# Patient Record
Sex: Female | Born: 1970 | Race: Black or African American | Hispanic: No | Marital: Single | Smoking: Never smoker
Health system: Southern US, Community
[De-identification: ages and names within clinical notes are randomized; demographics above are authoritative.]

## PROBLEM LIST (undated history)

## (undated) DIAGNOSIS — J329 Chronic sinusitis, unspecified: Secondary | ICD-10-CM

## (undated) DIAGNOSIS — R600 Localized edema: Secondary | ICD-10-CM

## (undated) DIAGNOSIS — E119 Type 2 diabetes mellitus without complications: Secondary | ICD-10-CM

## (undated) DIAGNOSIS — Z8719 Personal history of other diseases of the digestive system: Secondary | ICD-10-CM

## (undated) DIAGNOSIS — N6009 Solitary cyst of unspecified breast: Secondary | ICD-10-CM

## (undated) DIAGNOSIS — D649 Anemia, unspecified: Secondary | ICD-10-CM

## (undated) DIAGNOSIS — I10 Essential (primary) hypertension: Secondary | ICD-10-CM

## (undated) DIAGNOSIS — R51 Headache: Secondary | ICD-10-CM

## (undated) DIAGNOSIS — R519 Headache, unspecified: Secondary | ICD-10-CM

## (undated) DIAGNOSIS — T7840XA Allergy, unspecified, initial encounter: Secondary | ICD-10-CM

## (undated) HISTORY — PX: WISDOM TOOTH EXTRACTION: SHX21

## (undated) HISTORY — PX: BREAST BIOPSY: SHX20

## (undated) HISTORY — DX: Allergy, unspecified, initial encounter: T78.40XA

---

## 1999-10-08 ENCOUNTER — Other Ambulatory Visit: Admission: RE | Admit: 1999-10-08 | Discharge: 1999-10-08 | Payer: Self-pay | Admitting: Family Medicine

## 2004-03-11 ENCOUNTER — Ambulatory Visit: Payer: Self-pay | Admitting: Family Medicine

## 2005-05-28 ENCOUNTER — Ambulatory Visit: Payer: Self-pay | Admitting: Family Medicine

## 2005-08-06 ENCOUNTER — Ambulatory Visit: Payer: Self-pay | Admitting: Internal Medicine

## 2005-09-03 ENCOUNTER — Other Ambulatory Visit: Admission: RE | Admit: 2005-09-03 | Discharge: 2005-09-03 | Payer: Self-pay | Admitting: Family Medicine

## 2005-09-03 ENCOUNTER — Ambulatory Visit: Payer: Self-pay | Admitting: Family Medicine

## 2005-09-06 ENCOUNTER — Ambulatory Visit (HOSPITAL_COMMUNITY): Admission: RE | Admit: 2005-09-06 | Discharge: 2005-09-06 | Payer: Self-pay | Admitting: Family Medicine

## 2005-11-02 ENCOUNTER — Ambulatory Visit (HOSPITAL_COMMUNITY): Admission: RE | Admit: 2005-11-02 | Discharge: 2005-11-02 | Payer: Self-pay | Admitting: Family Medicine

## 2006-06-13 ENCOUNTER — Ambulatory Visit: Payer: Self-pay | Admitting: Family Medicine

## 2006-06-20 ENCOUNTER — Ambulatory Visit (HOSPITAL_COMMUNITY): Admission: RE | Admit: 2006-06-20 | Discharge: 2006-06-20 | Payer: Self-pay | Admitting: Family Medicine

## 2006-08-30 ENCOUNTER — Encounter: Payer: Self-pay | Admitting: Family Medicine

## 2006-08-30 LAB — CONVERTED CEMR LAB
Basophils Absolute: 0 10*3/uL (ref 0.0–0.1)
Basophils Relative: 1 % (ref 0–1)
CO2: 23 meq/L (ref 19–32)
Calcium: 9.5 mg/dL (ref 8.4–10.5)
Chloride: 107 meq/L (ref 96–112)
Creatinine, Ser: 0.86 mg/dL (ref 0.40–1.20)
HDL: 66 mg/dL (ref >39)
Hemoglobin: 12.1 g/dL (ref 12.0–15.0)
LDL Cholesterol: 85 mg/dL (ref 0–99)
Lymphocytes Relative: 48 % — ABNORMAL HIGH (ref 12–46)
MCHC: 31.3 g/dL (ref 30.0–36.0)
Monocytes Absolute: 0.4 10*3/uL (ref 0.2–0.7)
Neutro Abs: 1.6 10*3/uL — ABNORMAL LOW (ref 1.7–7.7)
Platelets: 249 10*3/uL (ref 150–400)
RDW: 14.4 % — ABNORMAL HIGH (ref 11.5–14.0)
Sodium: 139 meq/L (ref 135–145)
Total CHOL/HDL Ratio: 2.4
Triglycerides: 39 mg/dL (ref <150)

## 2006-09-05 ENCOUNTER — Ambulatory Visit: Payer: Self-pay | Admitting: Family Medicine

## 2006-09-05 ENCOUNTER — Other Ambulatory Visit: Admission: RE | Admit: 2006-09-05 | Discharge: 2006-09-05 | Payer: Self-pay | Admitting: Family Medicine

## 2006-09-05 ENCOUNTER — Encounter: Payer: Self-pay | Admitting: Family Medicine

## 2007-01-26 ENCOUNTER — Encounter: Payer: Self-pay | Admitting: Family Medicine

## 2007-02-16 ENCOUNTER — Encounter: Payer: Self-pay | Admitting: Family Medicine

## 2007-09-26 ENCOUNTER — Telehealth: Payer: Self-pay | Admitting: Family Medicine

## 2009-12-26 ENCOUNTER — Ambulatory Visit (HOSPITAL_COMMUNITY): Admission: RE | Admit: 2009-12-26 | Discharge: 2009-12-26 | Payer: Self-pay | Admitting: Family Medicine

## 2010-01-02 ENCOUNTER — Encounter: Payer: Self-pay | Admitting: Family Medicine

## 2010-01-02 ENCOUNTER — Telehealth: Payer: Self-pay | Admitting: Family Medicine

## 2010-01-12 ENCOUNTER — Encounter: Admission: RE | Admit: 2010-01-12 | Discharge: 2010-01-12 | Payer: Self-pay | Admitting: Family Medicine

## 2010-03-17 NOTE — Progress Notes (Signed)
Summary: returning call  Phone Note Call from Patient   Summary of Call: returned call call back at her work # [phone] Initial call taken by: Lind Guest,  January 02, 2010 8:19 AM  Follow-up for Phone Call        returned call, left message Follow-up by: Adella Hare LPN,  January 02, 2010 8:29 AM  Additional Follow-up for Phone Call Additional follow up Details #1::        patient has been contacted by breast center and has appt Additional Follow-up by: Adella Hare LPN,  January 02, 2010 2:02 PM

## 2010-03-17 NOTE — Letter (Signed)
Summary: breast center  breast center   Imported By: Lind Guest 01/02/2010 14:37:29  _____________________________________________________________________  External Attachment:    Type:   Image     Comment:   External Document

## 2010-03-17 NOTE — Letter (Signed)
Summary: [facility] chart  [facility] chart   Imported By: Curtis Sites 08/08/2009 08:50:11  _____________________________________________________________________  External Attachment:    Type:   Image     Comment:   External Document

## 2010-07-14 ENCOUNTER — Other Ambulatory Visit: Payer: Self-pay | Admitting: Family Medicine

## 2010-07-14 DIAGNOSIS — N6009 Solitary cyst of unspecified breast: Secondary | ICD-10-CM

## 2010-07-24 ENCOUNTER — Ambulatory Visit
Admission: RE | Admit: 2010-07-24 | Discharge: 2010-07-24 | Disposition: A | Discharge status: Home / Self Care | Payer: BC Managed Care – PPO | Source: Ambulatory Visit | Attending: Family Medicine | Admitting: Family Medicine

## 2010-07-24 DIAGNOSIS — N6009 Solitary cyst of unspecified breast: Secondary | ICD-10-CM

## 2011-05-03 ENCOUNTER — Ambulatory Visit: Payer: BC Managed Care – PPO | Admitting: Family Medicine

## 2011-05-14 ENCOUNTER — Other Ambulatory Visit: Payer: Self-pay | Admitting: Family Medicine

## 2011-05-14 DIAGNOSIS — Z1231 Encounter for screening mammogram for malignant neoplasm of breast: Secondary | ICD-10-CM

## 2011-05-17 ENCOUNTER — Ambulatory Visit: Payer: BC Managed Care – PPO

## 2011-05-24 ENCOUNTER — Telehealth: Payer: Self-pay | Admitting: Family Medicine

## 2011-05-24 ENCOUNTER — Other Ambulatory Visit: Payer: Self-pay | Admitting: Family Medicine

## 2011-05-24 ENCOUNTER — Ambulatory Visit
Admission: RE | Admit: 2011-05-24 | Discharge: 2011-05-24 | Disposition: A | Discharge status: Home / Self Care | Payer: BC Managed Care – PPO | Source: Ambulatory Visit | Attending: Family Medicine | Admitting: Family Medicine

## 2011-05-24 DIAGNOSIS — Z1231 Encounter for screening mammogram for malignant neoplasm of breast: Secondary | ICD-10-CM

## 2011-05-25 ENCOUNTER — Other Ambulatory Visit: Payer: Self-pay | Admitting: Family Medicine

## 2011-05-25 DIAGNOSIS — R928 Other abnormal and inconclusive findings on diagnostic imaging of breast: Secondary | ICD-10-CM

## 2011-05-27 ENCOUNTER — Other Ambulatory Visit: Payer: Self-pay | Admitting: Family Medicine

## 2011-05-27 ENCOUNTER — Ambulatory Visit
Admission: RE | Admit: 2011-05-27 | Discharge: 2011-05-27 | Disposition: A | Discharge status: Home / Self Care | Payer: BC Managed Care – PPO | Source: Ambulatory Visit | Attending: Family Medicine | Admitting: Family Medicine

## 2011-05-27 DIAGNOSIS — N6320 Unspecified lump in the left breast, unspecified quadrant: Secondary | ICD-10-CM

## 2011-05-27 DIAGNOSIS — R928 Other abnormal and inconclusive findings on diagnostic imaging of breast: Secondary | ICD-10-CM

## 2011-05-30 NOTE — Telephone Encounter (Signed)
Pt contacted re abnormal mammogram and need for follow up, appt has been made to see Dr Jeanice Lim and she is aware of abn mammogram, of not she has an aunt dx with breast ca under the age of 34

## 2011-06-01 ENCOUNTER — Ambulatory Visit (INDEPENDENT_AMBULATORY_CARE_PROVIDER_SITE_OTHER): Payer: BC Managed Care – PPO | Admitting: Family Medicine

## 2011-06-01 ENCOUNTER — Encounter: Payer: Self-pay | Admitting: Family Medicine

## 2011-06-01 VITALS — BP 140/90 | HR 82 | Resp 16 | Ht 65.0 in | Wt 185.4 lb

## 2011-06-01 DIAGNOSIS — K5900 Constipation, unspecified: Secondary | ICD-10-CM | POA: Insufficient documentation

## 2011-06-01 DIAGNOSIS — K644 Residual hemorrhoidal skin tags: Secondary | ICD-10-CM

## 2011-06-01 DIAGNOSIS — J302 Other seasonal allergic rhinitis: Secondary | ICD-10-CM

## 2011-06-01 DIAGNOSIS — Z1321 Encounter for screening for nutritional disorder: Secondary | ICD-10-CM

## 2011-06-01 DIAGNOSIS — E669 Obesity, unspecified: Secondary | ICD-10-CM | POA: Insufficient documentation

## 2011-06-01 DIAGNOSIS — N6009 Solitary cyst of unspecified breast: Secondary | ICD-10-CM

## 2011-06-01 DIAGNOSIS — Z130 Encounter for screening for diseases of the blood and blood-forming organs and certain disorders involving the immune mechanism: Secondary | ICD-10-CM

## 2011-06-01 DIAGNOSIS — J309 Allergic rhinitis, unspecified: Secondary | ICD-10-CM

## 2011-06-01 DIAGNOSIS — J3089 Other allergic rhinitis: Secondary | ICD-10-CM | POA: Insufficient documentation

## 2011-06-01 MED ORDER — HYDROCORTISONE 2.5 % RE CREA: TOPICAL_CREAM | RECTAL | Status: AC

## 2011-06-01 MED ORDER — DOCUSATE SODIUM 100 MG PO CAPS: 100.0000 mg | ORAL_CAPSULE | Freq: Every day | ORAL | Status: AC | PRN

## 2011-06-01 NOTE — Progress Notes (Signed)
  Subjective:    Patient ID: Alexa Savage, female    DOB: 1970/10/31, 41 y.o.   MRN: [id number]  HPI  Patient here to reestablish care. Previous PCP Dr. Lodema Hong. She's not been seen approximately 2 years. Her previous GYN with Dr. Cherly Hensen in [facility].  Feels of bump in her rectal area she thinks this may be an abscess. This has been present for the past week. Denies any drainage or bleeding. She does have a history of constipation which has a bowel movement every 2-3 days. She does not take any over-the-counter medications for this.  Breast cyst- recently abnormal mammogram which was a followup from 10 months ago which were noted on the left side. She has new cyst and calcifications therefore has opted to have a breast biopsy. Which will be next week.   Review of Systems  GEN- denies fatigue, fever, weight loss,weakness, recent illness HEENT- denies eye drainage, change in vision, nasal discharge, CVS- denies chest pain, palpitations RESP- denies SOB, cough, wheeze ABD- denies N/V, +constipation , abd pain GU- denies dysuria, hematuria, dribbling, incontinence MSK- denies joint pain, muscle aches, injury Neuro- denies headache, dizziness, syncope, seizure activity       Objective:   Physical Exam GEN- NAD, alert and oriented x3 HEENT- PERRL, EOMI, non injected sclera, pink conjunctiva, MMM, oropharynx clear Neck- Supple, no thyromegaly CVS- RRR, no murmur RESP-CTAB ABD-NABS,soft, NT,ND EXT- No edema Pulses- Radial, DP- 2+ FOBT- NEG Rectal- normal tone, small external hemorroid minimal swelling, mild tenderness to palpation       Assessment & Plan:

## 2011-06-01 NOTE — Patient Instructions (Signed)
Please schedule a physical exam with Dr. Lodema Hong for PAP Smear Get the labs done fasting at least 48 hours before the visit Take the stool softener and use the anusol cream for pain Hemorrhoids Hemorrhoids are veins in the rectum that get big. These veins can get blocked. Blocked veins become puffy (swollen) and painful. HOME CARE  Eat more fiber.   Drink enough fluid to keep your pee (urine) clear or pale yellow.   Exercise often.   Avoid straining to poop (bowel movement).   Keep the butt area dry and clean.   Only take medicine as told by your doctor.  If your hemorrhoids are puffy and painful:  Take a warm bath for 20 to 30 minutes. Do this 3 to 4 times a day.   Place ice packs on the area. Use the ice packs between the baths.   Put ice in a plastic bag.   Place a towel between your skin and the bag.   Leave the ice on for 15 to 20 minutes, 3 to 4 times a day.   Do not use a donut-shaped pillow. Do not sit on the toilet for a long time.   Go to the bathroom when your body has the urge to poop. This is so you do not strain as much to poop.  GET HELP RIGHT AWAY IF:    You have increasing pain that is not controlled with medicine.   You have uncontrolled bleeding.   You cannot poop.   You have pain or puffiness outside the area of the hemorrhoids.   You have chills.   You have a temperature by mouth above 102 F (38.9 C), not controlled by medicine.  MAKE SURE YOU:    Understand these instructions.   Will watch your condition.   Will get help right away if you are not doing well or get worse.  Document Released: 11/11/2007 Document Revised: 01/21/2011 Document Reviewed: 11/11/2007 [facility] Patient Information 2012 [place], [place].

## 2011-06-01 NOTE — Assessment & Plan Note (Signed)
Currently not thrombosed and > 3 days, therefore will treat conservatively with sitz baths, anusol cream and stool softener

## 2011-06-01 NOTE — Assessment & Plan Note (Signed)
Per above , fiber, water, stool softener

## 2011-06-01 NOTE — Assessment & Plan Note (Signed)
Currently being followed by the breast center- has biopsy pending for next week, she has history of breast cyst on the left side however has a new cyst in a different area and pt choose to have this biopsied

## 2011-06-01 NOTE — Assessment & Plan Note (Signed)
OTC zyrtec

## 2011-06-08 ENCOUNTER — Ambulatory Visit
Admission: RE | Admit: 2011-06-08 | Discharge: 2011-06-08 | Disposition: A | Discharge status: Home / Self Care | Payer: BC Managed Care – PPO | Source: Ambulatory Visit | Attending: Family Medicine | Admitting: Family Medicine

## 2011-06-08 ENCOUNTER — Other Ambulatory Visit: Payer: Self-pay | Admitting: Radiology

## 2011-06-08 ENCOUNTER — Other Ambulatory Visit: Payer: Self-pay | Admitting: Family Medicine

## 2011-06-08 DIAGNOSIS — N6320 Unspecified lump in the left breast, unspecified quadrant: Secondary | ICD-10-CM

## 2011-07-13 ENCOUNTER — Ambulatory Visit: Payer: BC Managed Care – PPO | Admitting: Family Medicine

## 2011-07-29 LAB — COMPREHENSIVE METABOLIC PANEL
ALT: 10 U/L (ref 0–35)
AST: 14 U/L (ref 0–37)
Alkaline Phosphatase: 55 U/L (ref 39–117)
Chloride: 108 mEq/L (ref 96–112)
Glucose, Bld: 83 mg/dL (ref 70–99)
Potassium: 4.4 mEq/L (ref 3.5–5.3)
Total Bilirubin: 0.4 mg/dL (ref 0.3–1.2)
Total Protein: 6.7 g/dL (ref 6.0–8.3)

## 2011-07-29 LAB — CBC WITH DIFFERENTIAL/PLATELET
Basophils Absolute: 0 10*3/uL (ref 0.0–0.1)
Eosinophils Absolute: 0.1 10*3/uL (ref 0.0–0.7)
Eosinophils Relative: 2 % (ref 0–5)
MCH: 27.9 pg (ref 26.0–34.0)
MCHC: 32.3 g/dL (ref 30.0–36.0)
MCV: 86.4 fL (ref 78.0–100.0)
Platelets: 276 10*3/uL (ref 150–400)
RDW: 14.7 % (ref 11.5–15.5)

## 2011-07-29 LAB — LIPID PANEL
LDL Cholesterol: 90 mg/dL (ref 0–99)
VLDL: 8 mg/dL (ref 0–40)

## 2011-08-02 LAB — VITAMIN D 1,25 DIHYDROXY
Vitamin D 1, 25 (OH)2 Total: 52 pg/mL (ref 18–72)
Vitamin D2 1, 25 (OH)2: <8 pg/mL

## 2011-08-06 ENCOUNTER — Ambulatory Visit (INDEPENDENT_AMBULATORY_CARE_PROVIDER_SITE_OTHER): Payer: BC Managed Care – PPO | Admitting: Family Medicine

## 2011-08-06 ENCOUNTER — Encounter: Payer: Self-pay | Admitting: Family Medicine

## 2011-08-06 ENCOUNTER — Other Ambulatory Visit (HOSPITAL_COMMUNITY)
Admission: RE | Admit: 2011-08-06 | Discharge: 2011-08-06 | Disposition: A | Discharge status: Home / Self Care | Payer: BC Managed Care – PPO | Source: Ambulatory Visit | Attending: Family Medicine | Admitting: Family Medicine

## 2011-08-06 ENCOUNTER — Telehealth: Payer: Self-pay | Admitting: Family Medicine

## 2011-08-06 VITALS — BP 124/82 | HR 80 | Resp 16 | Ht 65.0 in | Wt 188.1 lb

## 2011-08-06 DIAGNOSIS — Z01419 Encounter for gynecological examination (general) (routine) without abnormal findings: Secondary | ICD-10-CM | POA: Insufficient documentation

## 2011-08-06 DIAGNOSIS — Z0000 Encounter for general adult medical examination without abnormal findings: Secondary | ICD-10-CM

## 2011-08-06 DIAGNOSIS — Z124 Encounter for screening for malignant neoplasm of cervix: Secondary | ICD-10-CM

## 2011-08-06 DIAGNOSIS — D259 Leiomyoma of uterus, unspecified: Secondary | ICD-10-CM

## 2011-08-06 DIAGNOSIS — Z1211 Encounter for screening for malignant neoplasm of colon: Secondary | ICD-10-CM

## 2011-08-06 DIAGNOSIS — Z23 Encounter for immunization: Secondary | ICD-10-CM

## 2011-08-06 DIAGNOSIS — E663 Overweight: Secondary | ICD-10-CM

## 2011-08-06 DIAGNOSIS — D219 Benign neoplasm of connective and other soft tissue, unspecified: Secondary | ICD-10-CM | POA: Insufficient documentation

## 2011-08-06 DIAGNOSIS — N760 Acute vaginitis: Secondary | ICD-10-CM

## 2011-08-06 DIAGNOSIS — D649 Anemia, unspecified: Secondary | ICD-10-CM

## 2011-08-06 DIAGNOSIS — R5383 Other fatigue: Secondary | ICD-10-CM

## 2011-08-06 NOTE — Progress Notes (Signed)
  Subjective:    Patient ID: Alexa Savage, female    DOB: [birth year], 41 y.o.   MRN: [id number]  HPI The PT is here for annual exam  and re-evaluation of chronic medical conditions, medication management and review of any available recent lab and radiology data.  Preventive health is updated, specifically  Cancer screening and Immunization.   Questions or concerns regarding consultations or procedures which the PT has had in the interim are  Addressed.Was seen in urgent care a few months ago for left flank pain felt to be muscular in origin. This has ssentially resolved The PT has questions about resuming OCP,however is considering having a baby and she is 60, I advised against this No regular exercise , will change this        Review of Systems See HPI Denies recent fever or chills. Denies sinus pressure, nasal congestion, ear pain or sore throat. Denies chest congestion, productive cough or wheezing. Denies chest pains, palpitations and leg swelling Denies abdominal pain, nausea, vomiting,diarrhea or constipation.   Denies dysuria, frequency, hesitancy or incontinence. Denies joint pain, swelling and limitation in mobility. Denies headaches, seizures, numbness, or tingling. Denies depression, anxiety or insomnia. Denies skin break down or rash.        Objective:   Physical Exam Pleasant well nourished female, alert and oriented x 3, in no cardio-pulmonary distress. Afebrile. HEENT No facial trauma or asymetry. Sinuses non tender.  EOMI, PERTL, fundoscopic exam is normal, no hemorhage or exudate.  External ears normal, tympanic membranes clear. Oropharynx moist, no exudate, good dentition. Neck: supple, no adenopathy,JVD or thyromegaly.No bruits.  Chest: Clear to ascultation bilaterally.No crackles or wheezes. Non tender to palpation  Breast: No asymetry,no masses. No nipple discharge or inversion. No axillary or supraclavicular adenopathy  Cardiovascular  system; Heart sounds normal,  S1 and  S2 ,no S3.  No murmur, or thrill. Apical beat not displaced Peripheral pulses normal.  Abdomen: Soft, non tender, no organomegaly or masses. No bruits. Bowel sounds normal. No guarding, tenderness or rebound.  Rectal:  No mass. Guaiac negative stool.  GU: External genitalia normal. No lesions. Vaginal canal normal.White discharge. Uterus enlarged up to 16 week size, no cervical motion or adnexal tenderness  Musculoskeletal exam: Full ROM of spine, hips , shoulders and knees. No deformity ,swelling or crepitus noted. No muscle wasting or atrophy.   Neurologic: Cranial nerves 2 to 12 intact. Power, tone ,sensation and reflexes normal throughout. No disturbance in gait. No tremor.  Skin: Intact, no ulceration, erythema , scaling or rash noted. Pigmentation normal throughout  Psych; Normal mood and affect. Judgement and concentration normal        Assessment & Plan:

## 2011-08-06 NOTE — Assessment & Plan Note (Signed)
Enlarged womb with frequent , every 3 week heavy cycles and mild anemia, will do [place]. Past h/o ovarian cyst

## 2011-08-06 NOTE — Assessment & Plan Note (Signed)
Pt counseled re the importance of commitment to regular physical activity, healthy diet and attaining a healthy weight by weight loss. Use of condoms and seat belts also discussed. Regular self breast exam encouraged and taught

## 2011-08-06 NOTE — Telephone Encounter (Signed)
I suggest you also try to call her at work with appt, will sign again!

## 2011-08-06 NOTE — Assessment & Plan Note (Signed)
Deteriorated. Patient re-educated about  the importance of commitment to a  minimum of 150 minutes of exercise per week. The importance of healthy food choices with portion control discussed. Encouraged to start a food diary, count calories and to consider  joining a support group. Sample diet sheets offered. Goals set by the patient for the next several months.    

## 2011-08-06 NOTE — Patient Instructions (Signed)
F/u first week in December, call if you need me before.  Start centrum one daily and one iron tablet 325mg  one daily  CBc, iron and ferritin and HBA1C in December, non fasting  Pls cut out sweet drinks and candy, eat more fruit, vegetables fresh or frozen, and drink water.  Commit to daily exercise for improved health  Weight loss goal of 1.5 to 2 pounds daily   TdAP today.  You are referred for pelvic ultrasound

## 2011-08-07 LAB — WET PREP BY MOLECULAR PROBE
Candida species: NEGATIVE
Trichomonas vaginosis: NEGATIVE

## 2011-08-07 LAB — GC/CHLAMYDIA PROBE AMP, GENITAL
Chlamydia, DNA Probe: NEGATIVE
GC Probe Amp, Genital: NEGATIVE

## 2011-08-09 ENCOUNTER — Telehealth: Payer: Self-pay | Admitting: Family Medicine

## 2011-08-10 ENCOUNTER — Ambulatory Visit (HOSPITAL_COMMUNITY)
Admission: RE | Admit: 2011-08-10 | Discharge: 2011-08-10 | Disposition: A | Discharge status: Home / Self Care | Payer: BC Managed Care – PPO | Source: Ambulatory Visit | Attending: Family Medicine | Admitting: Family Medicine

## 2011-08-10 DIAGNOSIS — D219 Benign neoplasm of connective and other soft tissue, unspecified: Secondary | ICD-10-CM

## 2011-08-10 DIAGNOSIS — D259 Leiomyoma of uterus, unspecified: Secondary | ICD-10-CM | POA: Insufficient documentation

## 2011-08-10 DIAGNOSIS — R9389 Abnormal findings on diagnostic imaging of other specified body structures: Secondary | ICD-10-CM | POA: Insufficient documentation

## 2011-08-10 DIAGNOSIS — N926 Irregular menstruation, unspecified: Secondary | ICD-10-CM | POA: Insufficient documentation

## 2011-08-12 MED ORDER — METRONIDAZOLE 500 MG PO TABS: 500.0000 mg | ORAL_TABLET | Freq: Two times a day (BID) | ORAL | Status: AC

## 2011-08-12 NOTE — Addendum Note (Signed)
Addended by: Abner Greenspan on: 08/12/2011 02:59 PM   Modules accepted: Orders

## 2011-08-13 ENCOUNTER — Other Ambulatory Visit: Payer: Self-pay

## 2011-08-13 MED ORDER — FLUCONAZOLE 150 MG PO TABS: 150.0000 mg | ORAL_TABLET | Freq: Once | ORAL | Status: AC

## 2011-08-16 NOTE — Telephone Encounter (Signed)
Patient is aware 

## 2012-01-21 ENCOUNTER — Encounter: Payer: Self-pay | Admitting: Family Medicine

## 2012-01-21 ENCOUNTER — Ambulatory Visit (INDEPENDENT_AMBULATORY_CARE_PROVIDER_SITE_OTHER): Payer: BC Managed Care – PPO | Admitting: Family Medicine

## 2012-01-21 VITALS — BP 108/80 | HR 89 | Resp 15 | Ht 65.0 in | Wt 187.1 lb

## 2012-01-21 DIAGNOSIS — D219 Benign neoplasm of connective and other soft tissue, unspecified: Secondary | ICD-10-CM

## 2012-01-21 DIAGNOSIS — D649 Anemia, unspecified: Secondary | ICD-10-CM

## 2012-01-21 DIAGNOSIS — M549 Dorsalgia, unspecified: Secondary | ICD-10-CM | POA: Insufficient documentation

## 2012-01-21 DIAGNOSIS — J309 Allergic rhinitis, unspecified: Secondary | ICD-10-CM

## 2012-01-21 DIAGNOSIS — R5381 Other malaise: Secondary | ICD-10-CM

## 2012-01-21 DIAGNOSIS — D259 Leiomyoma of uterus, unspecified: Secondary | ICD-10-CM

## 2012-01-21 DIAGNOSIS — J302 Other seasonal allergic rhinitis: Secondary | ICD-10-CM

## 2012-01-21 DIAGNOSIS — R5383 Other fatigue: Secondary | ICD-10-CM

## 2012-01-21 DIAGNOSIS — E669 Obesity, unspecified: Secondary | ICD-10-CM

## 2012-01-21 DIAGNOSIS — Z139 Encounter for screening, unspecified: Secondary | ICD-10-CM

## 2012-01-21 LAB — IRON: Iron: 92 ug/dL (ref 42–145)

## 2012-01-21 LAB — CBC WITH DIFFERENTIAL/PLATELET
Basophils Absolute: 0.1 10*3/uL (ref 0.0–0.1)
Basophils Relative: 2 % — ABNORMAL HIGH (ref 0–1)
Lymphocytes Relative: 43 % (ref 12–46)
MCHC: 32.7 g/dL (ref 30.0–36.0)
Neutro Abs: 1.3 10*3/uL — ABNORMAL LOW (ref 1.7–7.7)
Neutrophils Relative %: 41 % — ABNORMAL LOW (ref 43–77)
RDW: 14.5 % (ref 11.5–15.5)
WBC: 3.1 10*3/uL — ABNORMAL LOW (ref 4.0–10.5)

## 2012-01-21 LAB — HEMOGLOBIN A1C: Hgb A1c MFr Bld: 5.5 % (ref <5.7)

## 2012-01-21 LAB — FERRITIN: Ferritin: 10 ng/mL (ref 10–291)

## 2012-01-21 MED ORDER — IBUPROFEN 800 MG PO TABS: ORAL_TABLET | ORAL | Status: DC

## 2012-01-21 MED ORDER — NORETHINDRONE-ETH ESTRADIOL 1-35 MG-MCG PO TABS: 1.0000 | ORAL_TABLET | Freq: Every day | ORAL | Status: DC

## 2012-01-21 NOTE — Patient Instructions (Addendum)
CPE August 06, 2012.  Please commit to 30 minutes 3 days per week at lunchtime of exercise.  Resume birth control pills to better control bleeding  CBC , iron , ferritin and hBA1C today.  Ibuprofen as needed for back pain, max of 4 per week   Practice good back hygiene  Good sleep hygiene is essential, target of 7 hours per night  Fasting lipid, cmp, cbc, HBA1C, TSH in mid June  Weight loss goal of 12 to 15 pounds  Restrict intake to 1500 cals per day, we will provide  A sample

## 2012-01-21 NOTE — Progress Notes (Signed)
  Subjective:    Patient ID: Alexa Savage, female    DOB: 08-08-1970, 41 y.o.   MRN: [id number]  HPI The PT is here for follow up and re-evaluation of chronic medical conditions, medication management and review of any available recent lab and radiology data.  Preventive health is updated, specifically  Cancer screening and Immunization.   Questions or concerns regarding consultations or procedures which the PT has had in the interim are  addressed. The PT denies any adverse reactions to current medications since the last visit.  Continues to experience intermittent low back pain, esp at end of workday, tylenol relieves Has not comited to significant lifestyle change for improved health and weight loss there is a f/h of diabetes, the importance of changing lifestyle to prevent diabetes as well as the fact that thois will improve back pain is stressed      Review of Systems See HPI Denies recent fever or chills. Denies sinus pressure, nasal congestion, ear pain or sore throat. Denies chest congestion, productive cough or wheezing. Denies chest pains, palpitations and leg swelling Denies abdominal pain, nausea, vomiting,diarrhea or constipation.   Denies dysuria, frequency, hesitancy or incontinence.  Denies headaches, seizures, numbness, or tingling. Denies depression, anxiety or insomnia. Denies skin break down or rash.        Objective:   Physical Exam  Patient alert and oriented and in no cardiopulmonary distress.  HEENT: No facial asymmetry, EOMI, no sinus tenderness,  oropharynx pink and moist.  Neck supple no adenopathy.  Chest: Clear to auscultation bilaterally.  CVS: S1, S2 no murmurs, no S3.  ABD: Soft non tender. Bowel sounds normal.  Ext: No edema  MS: Adequate ROM spine, shoulders, hips and knees.  Skin: Intact, no ulcerations or rash noted.  Psych: Good eye contact, normal affect. Memory intact not anxious or depressed appearing.  CNS: CN 2-12  intact, power, tone and sensation normal throughout.       Assessment & Plan:

## 2012-01-23 NOTE — Assessment & Plan Note (Signed)
Ongoing, on an intermittent basis , esp after sitting all day. Encouraged pt to work on weight loss, core strengthening exercise and improved back posture Declined offer for short term PT

## 2012-01-23 NOTE — Assessment & Plan Note (Signed)
No current flare, generally controlled with intermittent use of OTC meds

## 2012-01-23 NOTE — Assessment & Plan Note (Signed)
Reviewed recent US in office, due to desire to maintain fertility option, pt is deciding against gyne intervention. Will resume OCP to control bleeding better

## 2012-01-23 NOTE — Assessment & Plan Note (Signed)
Deteriorated. Patient re-educated about  the importance of commitment to a  minimum of 150 minutes of exercise per week. The importance of healthy food choices with portion control discussed. Encouraged to start a food diary, count calories and to consider  joining a support group. Sample diet sheets offered. Goals set by the patient for the next several months.    

## 2012-04-24 ENCOUNTER — Telehealth: Payer: Self-pay | Admitting: Family Medicine

## 2012-04-24 MED ORDER — FEXOFENADINE-PSEUDOEPHED ER 180-240 MG PO TB24: 1.0000 | ORAL_TABLET | Freq: Every day | ORAL | Status: DC

## 2012-04-24 NOTE — Telephone Encounter (Signed)
States she has been having clear nasal drainage x 2 weeks and has been using sudafed with no relief. Wants to know if she can have some allegra called in to her pharmacy

## 2012-04-24 NOTE — Addendum Note (Signed)
Addended by: Milinda Antis F on: 04/24/2012 01:43 PM   Modules accepted: Orders

## 2012-04-24 NOTE — Telephone Encounter (Signed)
Allegra D sent

## 2012-04-24 NOTE — Telephone Encounter (Signed)
Called patient and left message for them to return call at the office   

## 2012-07-26 ENCOUNTER — Other Ambulatory Visit: Payer: Self-pay

## 2012-07-26 DIAGNOSIS — Z1231 Encounter for screening mammogram for malignant neoplasm of breast: Secondary | ICD-10-CM

## 2012-08-11 ENCOUNTER — Encounter: Payer: BC Managed Care – PPO | Admitting: Family Medicine

## 2012-08-16 ENCOUNTER — Ambulatory Visit
Admission: RE | Admit: 2012-08-16 | Discharge: 2012-08-16 | Disposition: A | Discharge status: Home / Self Care | Payer: BC Managed Care – PPO | Source: Ambulatory Visit

## 2012-08-16 DIAGNOSIS — Z1231 Encounter for screening mammogram for malignant neoplasm of breast: Secondary | ICD-10-CM

## 2012-08-28 ENCOUNTER — Telehealth: Payer: Self-pay

## 2012-08-28 DIAGNOSIS — E66811 Obesity, class 1: Secondary | ICD-10-CM

## 2012-08-28 DIAGNOSIS — E669 Obesity, unspecified: Secondary | ICD-10-CM

## 2012-08-28 DIAGNOSIS — Z13220 Encounter for screening for lipoid disorders: Secondary | ICD-10-CM

## 2012-08-28 NOTE — Telephone Encounter (Signed)
Call to reorder labs

## 2012-08-29 LAB — COMPREHENSIVE METABOLIC PANEL WITH GFR
ALT: 8 U/L (ref 0–35)
AST: 12 U/L (ref 0–37)
Albumin: 3.6 g/dL (ref 3.5–5.2)
Alkaline Phosphatase: 54 U/L (ref 39–117)
BUN: 10 mg/dL (ref 6–23)
CO2: 26 meq/L (ref 19–32)
Calcium: 9.1 mg/dL (ref 8.4–10.5)
Chloride: 108 meq/L (ref 96–112)
Creat: 0.89 mg/dL (ref 0.50–1.10)
Glucose, Bld: 86 mg/dL (ref 70–99)
Potassium: 4.4 meq/L (ref 3.5–5.3)
Sodium: 138 meq/L (ref 135–145)
Total Bilirubin: 0.4 mg/dL (ref 0.3–1.2)
Total Protein: 6.4 g/dL (ref 6.0–8.3)

## 2012-08-29 LAB — HEMOGLOBIN A1C
Hgb A1c MFr Bld: 5.5 % (ref <5.7)
Mean Plasma Glucose: 111 mg/dL (ref <117)

## 2012-08-29 LAB — LIPID PANEL
Cholesterol: 149 mg/dL (ref 0–200)
HDL: 51 mg/dL (ref >39)
LDL Cholesterol: 82 mg/dL (ref 0–99)
Total CHOL/HDL Ratio: 2.9 ratio
Triglycerides: 79 mg/dL (ref <150)
VLDL: 16 mg/dL (ref 0–40)

## 2012-08-29 LAB — TSH: TSH: 2.104 u[IU]/mL (ref 0.350–4.500)

## 2012-09-01 ENCOUNTER — Ambulatory Visit (INDEPENDENT_AMBULATORY_CARE_PROVIDER_SITE_OTHER): Payer: BC Managed Care – PPO | Admitting: Family Medicine

## 2012-09-01 ENCOUNTER — Encounter: Payer: Self-pay | Admitting: Family Medicine

## 2012-09-01 VITALS — BP 122/82 | HR 82 | Resp 16 | Ht 65.0 in | Wt 197.0 lb

## 2012-09-01 DIAGNOSIS — J309 Allergic rhinitis, unspecified: Secondary | ICD-10-CM

## 2012-09-01 DIAGNOSIS — E669 Obesity, unspecified: Secondary | ICD-10-CM

## 2012-09-01 DIAGNOSIS — E66811 Obesity, class 1: Secondary | ICD-10-CM

## 2012-09-01 DIAGNOSIS — J302 Other seasonal allergic rhinitis: Secondary | ICD-10-CM

## 2012-09-01 DIAGNOSIS — M549 Dorsalgia, unspecified: Secondary | ICD-10-CM

## 2012-09-01 NOTE — Assessment & Plan Note (Signed)
Controlled, no change in medication  

## 2012-09-01 NOTE — Assessment & Plan Note (Signed)
Intermittent, not diabling, as needed ibuprofen only

## 2012-09-01 NOTE — Assessment & Plan Note (Signed)
Deteriorated. Patient re-educated about  the importance of commitment to a  minimum of 150 minutes of exercise per week. The importance of healthy food choices with portion control discussed. Encouraged to start a food diary, count calories and to consider  joining a support group. Sample diet sheets offered. Goals set by the patient for the next several months.    

## 2012-09-01 NOTE — Progress Notes (Signed)
  Subjective:    Patient ID: Alexa Savage, female    DOB: [birth year], 42 y.o.   MRN: [id number]  HPI The PT is here for follow up and re-evaluation of chronic medical conditions, medication management and review of any available recent lab and radiology data. Labs are excellent, mammogram normal Preventive health is updated, specifically  Cancer screening and Immunization.  Will have pap with gyne later today Questions or concerns regarding consultations or procedures which the PT has had in the interim are  Addressed.Saw gyne a few months ago re abdominal pain, fibroids and fertility issues discussed at that time There are no specific complaints  Uses work stress as the reason that she has neither comited to lifestyle change, nor succeeded in weight loss, admits to overeating sweets and carbs , and stays awake late at night      Review of Systems See HPI Denies recent fever or chills. Denies sinus pressure, nasal congestion, ear pain or sore throat.Allergisslightly increased 3 days ago, but resolved with med use Denies chest congestion, productive cough or wheezing. Denies chest pains, palpitations and leg swelling Denies abdominal pain, nausea, vomiting,diarrhea or constipation.   Denies dysuria, frequency, hesitancy or incontinence. Denies joint pain, swelling and limitation in mobility. Denies headaches, seizures, numbness, or tingling. Denies depression, anxiety or insomnia. Denies skin break down or rash.        Objective:   Physical Exam  Patient alert and oriented and in no cardiopulmonary distress.  HEENT: No facial asymmetry, EOMI, no sinus tenderness,  oropharynx pink and moist.  Neck supple no adenopathy.  Chest: Clear to auscultation bilaterally.  CVS: S1, S2 no murmurs, no S3.  ABD: Soft non tender. Bowel sounds normal.  Ext: No edema  MS: Adequate ROM spine, shoulders, hips and knees.  Skin: Intact, no ulcerations or rash noted.  Psych: Good eye  contact, normal affect. Memory intact not anxious or depressed appearing.  CNS: CN 2-12 intact, power, tone and sensation normal throughout.       Assessment & Plan:

## 2012-09-01 NOTE — Patient Instructions (Signed)
F/U in  4 month, call if you  Need me before  Labs are excellent , as is your blood pressure  Consider and check into coverage for colonoscopy at age 42, mention great aunt having colon ca and based on your race. Call your ins company  It is important that you exercise regularly at least 30 minutes 5 times a week. If you develop chest pain, have severe difficulty breathing, or feel very tired, stop exercising immediately and seek medical attention    Changes in eating habits as discussed

## 2012-12-21 ENCOUNTER — Other Ambulatory Visit: Payer: Self-pay

## 2013-01-05 ENCOUNTER — Ambulatory Visit: Payer: BC Managed Care – PPO | Admitting: Family Medicine

## 2013-01-23 ENCOUNTER — Telehealth: Payer: Self-pay | Admitting: Family Medicine

## 2013-01-23 NOTE — Telephone Encounter (Signed)
Clear sinus drainage for a few days. No other symptoms, has been using sudafed with no relief. Advised saline nasal spray with an allergy med like claritin and to call back if she develops fever, green drainage, etc

## 2013-06-26 ENCOUNTER — Ambulatory Visit (INDEPENDENT_AMBULATORY_CARE_PROVIDER_SITE_OTHER): Payer: BC Managed Care – PPO | Admitting: Family Medicine

## 2013-06-26 ENCOUNTER — Encounter: Payer: Self-pay | Admitting: Family Medicine

## 2013-06-26 ENCOUNTER — Other Ambulatory Visit: Payer: Self-pay | Admitting: Family Medicine

## 2013-06-26 VITALS — BP 150/100 | HR 83 | Resp 16 | Ht 65.0 in | Wt 203.0 lb

## 2013-06-26 DIAGNOSIS — Z139 Encounter for screening, unspecified: Secondary | ICD-10-CM

## 2013-06-26 DIAGNOSIS — E669 Obesity, unspecified: Secondary | ICD-10-CM

## 2013-06-26 DIAGNOSIS — N644 Mastodynia: Secondary | ICD-10-CM | POA: Insufficient documentation

## 2013-06-26 DIAGNOSIS — R7303 Prediabetes: Secondary | ICD-10-CM

## 2013-06-26 DIAGNOSIS — I10 Essential (primary) hypertension: Secondary | ICD-10-CM

## 2013-06-26 DIAGNOSIS — R7309 Other abnormal glucose: Secondary | ICD-10-CM

## 2013-06-26 LAB — CBC WITH DIFFERENTIAL/PLATELET
BASOS ABS: 0 10*3/uL (ref 0.0–0.1)
BASOS PCT: 1 % (ref 0–1)
EOS ABS: 0.1 10*3/uL (ref 0.0–0.7)
EOS PCT: 3 % (ref 0–5)
HCT: 39.6 % (ref 36.0–46.0)
Hemoglobin: 12.7 g/dL (ref 12.0–15.0)
LYMPHS ABS: 1.4 10*3/uL (ref 0.7–4.0)
Lymphocytes Relative: 40 % (ref 12–46)
MCH: 28.2 pg (ref 26.0–34.0)
MCHC: 32.1 g/dL (ref 30.0–36.0)
MCV: 88 fL (ref 78.0–100.0)
Monocytes Absolute: 0.3 10*3/uL (ref 0.1–1.0)
Monocytes Relative: 10 % (ref 3–12)
NEUTROS PCT: 46 % (ref 43–77)
Neutro Abs: 1.6 10*3/uL — ABNORMAL LOW (ref 1.7–7.7)
PLATELETS: 249 10*3/uL (ref 150–400)
RBC: 4.5 MIL/uL (ref 3.87–5.11)
RDW: 14.1 % (ref 11.5–15.5)
WBC: 3.4 10*3/uL — ABNORMAL LOW (ref 4.0–10.5)

## 2013-06-26 LAB — HEMOGLOBIN A1C
Hgb A1c MFr Bld: 5.7 % — ABNORMAL HIGH (ref <5.7)
MEAN PLASMA GLUCOSE: 117 mg/dL — AB (ref <117)

## 2013-06-26 LAB — COMPREHENSIVE METABOLIC PANEL
ALBUMIN: 4 g/dL (ref 3.5–5.2)
ALK PHOS: 60 U/L (ref 39–117)
ALT: 13 U/L (ref 0–35)
AST: 15 U/L (ref 0–37)
BUN: 7 mg/dL (ref 6–23)
CALCIUM: 9.2 mg/dL (ref 8.4–10.5)
CHLORIDE: 107 meq/L (ref 96–112)
CO2: 25 mEq/L (ref 19–32)
Creat: 0.85 mg/dL (ref 0.50–1.10)
GLUCOSE: 86 mg/dL (ref 70–99)
POTASSIUM: 4.8 meq/L (ref 3.5–5.3)
SODIUM: 141 meq/L (ref 135–145)
TOTAL PROTEIN: 6.7 g/dL (ref 6.0–8.3)
Total Bilirubin: 0.3 mg/dL (ref 0.2–1.2)

## 2013-06-26 LAB — LIPID PANEL
CHOLESTEROL: 179 mg/dL (ref 0–200)
HDL: 66 mg/dL (ref >39)
LDL Cholesterol: 99 mg/dL (ref 0–99)
Total CHOL/HDL Ratio: 2.7 Ratio
Triglycerides: 71 mg/dL (ref <150)
VLDL: 14 mg/dL (ref 0–40)

## 2013-06-26 MED ORDER — TRIAMTERENE-HCTZ 37.5-25 MG PO TABS: 1.0000 | ORAL_TABLET | Freq: Every day | ORAL | Status: DC

## 2013-06-26 NOTE — Patient Instructions (Addendum)
F/u in 4 weeks, call if you need me before  You are referred for a mammogram of right breast  EKG in office today, shows no sign of heart damage. You need to start medication for high blood pressure.  Please commit to 30 minutes exercise daily, also reduce salt intake and candy, increase fresh fruit and vegetable intake  Labs today, hBA1C, lipid , cmp , tSH CBC

## 2013-06-27 LAB — TSH: TSH: 1.726 u[IU]/mL (ref 0.350–4.500)

## 2013-07-01 DIAGNOSIS — R7303 Prediabetes: Secondary | ICD-10-CM | POA: Insufficient documentation

## 2013-07-01 NOTE — Assessment & Plan Note (Signed)
Patient educated about the importance of limiting  Carbohydrate intake , the need to commit to daily physical activity for a minimum of 30 minutes , and to commit weight loss. The fact that changes in all these areas will reduce or eliminate all together the development of diabetes is stressed.    

## 2013-07-01 NOTE — Progress Notes (Signed)
   Subjective:    Patient ID: Alexa Savage, female    DOB: 09/02/1970, 43 y.o.   MRN: [id number]  HPI C/o 3 week h/o right breast pain and feels a "lump" around the 11 o clock position, very concerned about breast  Cancer as present in her family. No recent breast trauma, denies nipple d/c or axillry nodes. Has not made any consistent to change her lifestyle and continues to gain weight  Unfortunnately, today her BP is markedly elevated, another family illness, so she will be started on medication with close f/u. Base;line EKG also obtained    Review of Systems See HPI Denies recent fever or chills. Denies sinus pressure, nasal congestion, ear pain or sore throat. Denies chest congestion, productive cough or wheezing. Denies chest pains, palpitations and leg swelling Denies abdominal pain, nausea, vomiting,diarrhea or constipation.   Denies dysuria, frequency, hesitancy or incontinence. Denies joint pain, swelling and limitation in mobility. Denies headaches, seizures, numbness, or tingling. Denies depression, anxiety or insomnia. Denies skin break down or rash.        Objective:   Physical Exam  BP 150/100  Pulse 83  Resp 16  Ht 5\' 5"  (1.651 m)  Wt 203 lb (92.08 kg)  BMI 33.78 kg/m2  SpO2 99% Patient alert and oriented and in no cardiopulmonary distress.  HEENT: No facial asymmetry, EOMI, no sinus tenderness,  oropharynx pink and moist.  Neck supple no adenopathy.  Chest: Clear to auscultation bilaterally. Breast: no asymetry, no nipple d/c . Tender in upper outer quadrant of right breast, possible nodule in area of concern as afar as lump is concerned CVS: S1, S2 no murmurs, no S3. EKG: NSR, no acute ischemia, no LVH ABD: Soft non tender. Bowel sounds normal.  Ext: No edema  MS: Adequate ROM spine, shoulders, hips and knees.  Skin: Intact, no ulcerations or rash noted.  Psych: Good eye contact, normal affect. Memory intact not anxious or depressed  appearing.  CNS: CN 2-12 intact, power, tone and sensation normal throughout.       Assessment & Plan:  Mastalgia in female 3 week h/o right mastalgia, pt also c/o lump around 11 o clock, positive f/h of breast cancer, will refer for imaging. No mass palpated by me on exam at [facility]  HTN (hypertension) Start medication.Positive f/h of hTN and BP markedly elevated at visit. DASH diet and commitment to daily physical activity for a minimum of 30 minutes discussed and encouraged, as a part of hypertension management. The importance of attaining a healthy weight is also discussed.   Obesity (BMI 30.0-34.9) Deteriorated. Patient re-educated about  the importance of commitment to a  minimum of 150 minutes of exercise per week. The importance of healthy food choices with portion control discussed. Encouraged to start a food diary, count calories and to consider  joining a support group. Sample diet sheets offered. Goals set by the patient for the next several months.     Prediabetes Patient educated about the importance of limiting  Carbohydrate intake , the need to commit to daily physical activity for a minimum of 30 minutes , and to commit weight loss. The fact that changes in all these areas will reduce or eliminate all together the development of diabetes is stressed.

## 2013-07-01 NOTE — Assessment & Plan Note (Signed)
3 week h/o right mastalgia, pt also c/o lump around 11 o clock, positive f/h of breast cancer, will refer for imaging. No mass palpated by me on exam at [facility]

## 2013-07-01 NOTE — Assessment & Plan Note (Signed)
Deteriorated. Patient re-educated about  the importance of commitment to a  minimum of 150 minutes of exercise per week. The importance of healthy food choices with portion control discussed. Encouraged to start a food diary, count calories and to consider  joining a support group. Sample diet sheets offered. Goals set by the patient for the next several months.    

## 2013-07-01 NOTE — Assessment & Plan Note (Signed)
Start medication.Positive f/h of hTN and BP markedly elevated at visit. DASH diet and commitment to daily physical activity for a minimum of 30 minutes discussed and encouraged, as a part of hypertension management. The importance of attaining a healthy weight is also discussed.

## 2013-07-05 ENCOUNTER — Ambulatory Visit
Admission: RE | Admit: 2013-07-05 | Discharge: 2013-07-05 | Disposition: A | Discharge status: Home / Self Care | Payer: BC Managed Care – PPO | Source: Ambulatory Visit | Attending: Family Medicine | Admitting: Family Medicine

## 2013-07-05 DIAGNOSIS — N644 Mastodynia: Secondary | ICD-10-CM

## 2013-07-17 ENCOUNTER — Encounter: Payer: Self-pay | Admitting: Family Medicine

## 2013-07-24 ENCOUNTER — Ambulatory Visit: Payer: BC Managed Care – PPO | Admitting: Family Medicine

## 2013-08-20 ENCOUNTER — Other Ambulatory Visit: Payer: Self-pay

## 2013-08-20 DIAGNOSIS — Z1231 Encounter for screening mammogram for malignant neoplasm of breast: Secondary | ICD-10-CM

## 2013-09-04 ENCOUNTER — Ambulatory Visit: Payer: BC Managed Care – PPO

## 2013-09-19 ENCOUNTER — Ambulatory Visit
Admission: RE | Admit: 2013-09-19 | Discharge: 2013-09-19 | Disposition: A | Discharge status: Home / Self Care | Payer: BC Managed Care – PPO | Source: Ambulatory Visit

## 2013-09-19 DIAGNOSIS — Z1231 Encounter for screening mammogram for malignant neoplasm of breast: Secondary | ICD-10-CM

## 2013-10-15 ENCOUNTER — Other Ambulatory Visit (HOSPITAL_COMMUNITY): Payer: Self-pay | Admitting: Obstetrics and Gynecology

## 2013-10-15 ENCOUNTER — Ambulatory Visit (INDEPENDENT_AMBULATORY_CARE_PROVIDER_SITE_OTHER): Payer: BC Managed Care – PPO | Admitting: Family Medicine

## 2013-10-15 ENCOUNTER — Encounter: Payer: Self-pay | Admitting: Family Medicine

## 2013-10-15 VITALS — BP 136/84 | HR 77 | Resp 16 | Ht 65.0 in | Wt 199.0 lb

## 2013-10-15 DIAGNOSIS — D219 Benign neoplasm of connective and other soft tissue, unspecified: Secondary | ICD-10-CM | POA: Insufficient documentation

## 2013-10-15 DIAGNOSIS — R7303 Prediabetes: Secondary | ICD-10-CM

## 2013-10-15 DIAGNOSIS — D259 Leiomyoma of uterus, unspecified: Secondary | ICD-10-CM

## 2013-10-15 DIAGNOSIS — E669 Obesity, unspecified: Secondary | ICD-10-CM

## 2013-10-15 DIAGNOSIS — R7309 Other abnormal glucose: Secondary | ICD-10-CM

## 2013-10-15 DIAGNOSIS — Z1211 Encounter for screening for malignant neoplasm of colon: Secondary | ICD-10-CM

## 2013-10-15 DIAGNOSIS — Z0000 Encounter for general adult medical examination without abnormal findings: Secondary | ICD-10-CM | POA: Insufficient documentation

## 2013-10-15 DIAGNOSIS — Z3141 Encounter for fertility testing: Secondary | ICD-10-CM

## 2013-10-15 DIAGNOSIS — I10 Essential (primary) hypertension: Secondary | ICD-10-CM

## 2013-10-15 LAB — BASIC METABOLIC PANEL
BUN: 8 mg/dL (ref 6–23)
CALCIUM: 9.5 mg/dL (ref 8.4–10.5)
CHLORIDE: 104 meq/L (ref 96–112)
CO2: 26 meq/L (ref 19–32)
Creat: 0.84 mg/dL (ref 0.50–1.10)
GLUCOSE: 85 mg/dL (ref 70–99)
Potassium: 4.2 mEq/L (ref 3.5–5.3)
Sodium: 137 mEq/L (ref 135–145)

## 2013-10-15 LAB — POC HEMOCCULT BLD/STL (OFFICE/1-CARD/DIAGNOSTIC): Fecal Occult Blood, POC: NEGATIVE

## 2013-10-15 LAB — HEMOGLOBIN A1C
Hgb A1c MFr Bld: 5.7 % — ABNORMAL HIGH (ref <5.7)
MEAN PLASMA GLUCOSE: 117 mg/dL — AB (ref <117)

## 2013-10-15 NOTE — Assessment & Plan Note (Signed)
Mass arising from pelvis to 29 weeks approx, pt has decidede on removal of fibroids, awaiting word from MD

## 2013-10-15 NOTE — Assessment & Plan Note (Signed)
Controlled, no change in medication DASH diet and commitment to daily physical activity for a minimum of 30 minutes discussed and encouraged, as a part of hypertension management. The importance of attaining a healthy weight is also discussed.  

## 2013-10-15 NOTE — Progress Notes (Signed)
   Subjective:    Patient ID: Alexa Savage, female    DOB: 1970-03-16, 43 y.o.   MRN: [id number]  HPI The PT is here for follow up and re-evaluation of chronic medical conditions, medication management and review of any available recent lab and radiology data.  Preventive health is updated, specifically  Cancer screening and Immunization.   Questions or concerns regarding consultations or procedures which the PT has had in the interim are  addressed. The PT denies any adverse reactions to current medications since the last visit. States she forgets to take her medication some times, states it causes her to run to the bathroom often Had pelvic and pap 2 weeks ago and has decided on myomectomy due to lower abdominal pressure and bloating from fibroids, awaiting date d for surgeryThere are no specific complaints  Walks 2 to 3 dfays per week and working on diet      Review of Systems See HPI Denies recent fever or chills. Denies sinus pressure, nasal congestion, ear pain or sore throat. Denies chest congestion, productive cough or wheezing. Denies chest pains, palpitations and leg swelling Denies , nausea, vomiting,diarrhea , occasionally has some constipation.  Denies change in stool or black stool Denies dysuria, frequency, hesitancy or incontinence. Denies joint pain, swelling and limitation in mobility. Denies headaches, seizures, numbness, or tingling. Denies depression, anxiety or insomnia. Denies skin break down or rash.        Objective:   Physical Exam BP 136/84  Pulse 77  Resp 16  Ht 5\' 5"  (1.651 m)  Wt 199 lb (90.266 kg)  BMI 33.12 kg/m2  SpO2 99% Patient alert and oriented and in no cardiopulmonary distress.  HEENT: No facial asymmetry, EOMI,   oropharynx pink and moist.  Neck supple no JVD, no mass.  Chest: Clear to auscultation bilaterally.  CVS: S1, S2 no murmurs, no S3.Regular rate.  ABD: Soft , 22 week size mass arising from pelvis , non tender.  Normal BS Rectal : good sphincter tone, no anal mass or ulceration, stool is heme negative  Ext: No edema  MS: Adequate ROM spine, shoulders, hips and knees.  Skin: Intact, no ulcerations or rash noted.  Psych: Good eye contact, normal affect. Memory intact not anxious or depressed appearing.  CNS: CN 2-12 intact, power,  normal throughout.no focal deficits noted.        Assessment & Plan:  HTN (hypertension) Controlled, no change in medication DASH diet and commitment to daily physical activity for a minimum of 30 minutes discussed and encouraged, as a part of hypertension management. The importance of attaining a healthy weight is also discussed.   Obesity (BMI 30.0-34.9) Improved. Pt applauded on succesful weight loss through lifestyle change, and encouraged to continue same. Weight loss goal set for the next several months.   Prediabetes Patient educated about the importance of limiting  Carbohydrate intake , the need to commit to daily physical activity for a minimum of 30 minutes , and to commit weight loss. The fact that changes in all these areas will reduce or eliminate all together the development of diabetes is stressed.   Updated lab needed .   Fibroid Mass arising from pelvis to 29 weeks approx, pt has decidede on removal of fibroids, awaiting word from MD  Special screening for malignant neoplasms, colon Negative FOB, discussed possibility , based on race , colonoscopy starting at age 59

## 2013-10-15 NOTE — Assessment & Plan Note (Signed)
Improved. Pt applauded on succesful weight loss through lifestyle change, and encouraged to continue same. Weight loss goal set for the next several months.  

## 2013-10-15 NOTE — Assessment & Plan Note (Signed)
Negative FOB, discussed possibility , based on race , colonoscopy starting at age 43

## 2013-10-15 NOTE — Patient Instructions (Addendum)
F/u first week in [place], call if you need me before  BP is good take med at same time every day  It is important that you exercise regularly at least 30 minutes 5 times a week. If you develop chest pain, have severe difficulty breathing, or feel very tired, stop exercising immediately and seek medical attention   A healthy diet is rich in fruit, vegetables and whole grains. Poultry fish, nuts and beans are a healthy choice for protein rather then red meat. A low sodium diet and drinking 64 ounces of water daily is generally recommended. Oils and sweet should be limited. Carbohydrates especially for those who are diabetic or overweight, should be limited to 60-45 gram per meal. It is important to eat on a regular schedule, at least 3 times daily. Snacks should be primarily fruits, vegetables or nuts.  Labs today HBA1C and chem 7  Rectal exam today is normal

## 2013-10-15 NOTE — Assessment & Plan Note (Signed)
Patient educated about the importance of limiting  Carbohydrate intake , the need to commit to daily physical activity for a minimum of 30 minutes , and to commit weight loss. The fact that changes in all these areas will reduce or eliminate all together the development of diabetes is stressed.   Updated lab needed 

## 2013-11-12 ENCOUNTER — Other Ambulatory Visit: Payer: Self-pay | Admitting: Obstetrics and Gynecology

## 2013-11-14 ENCOUNTER — Encounter (HOSPITAL_COMMUNITY): Payer: Self-pay | Admitting: Pharmacist

## 2013-11-21 ENCOUNTER — Encounter (HOSPITAL_COMMUNITY): Payer: Self-pay

## 2013-11-21 ENCOUNTER — Encounter (HOSPITAL_COMMUNITY)
Admission: RE | Admit: 2013-11-21 | Discharge: 2013-11-21 | Disposition: A | Discharge status: Home / Self Care | Payer: BC Managed Care – PPO | Source: Ambulatory Visit | Attending: Obstetrics and Gynecology | Admitting: Obstetrics and Gynecology

## 2013-11-21 DIAGNOSIS — Z0000 Encounter for general adult medical examination without abnormal findings: Secondary | ICD-10-CM | POA: Diagnosis not present

## 2013-11-21 DIAGNOSIS — Z6833 Body mass index (BMI) 33.0-33.9, adult: Secondary | ICD-10-CM | POA: Insufficient documentation

## 2013-11-21 DIAGNOSIS — I10 Essential (primary) hypertension: Secondary | ICD-10-CM | POA: Insufficient documentation

## 2013-11-21 DIAGNOSIS — K648 Other hemorrhoids: Secondary | ICD-10-CM | POA: Insufficient documentation

## 2013-11-21 DIAGNOSIS — N938 Other specified abnormal uterine and vaginal bleeding: Secondary | ICD-10-CM | POA: Insufficient documentation

## 2013-11-21 DIAGNOSIS — R7309 Other abnormal glucose: Secondary | ICD-10-CM | POA: Insufficient documentation

## 2013-11-21 DIAGNOSIS — D259 Leiomyoma of uterus, unspecified: Secondary | ICD-10-CM | POA: Insufficient documentation

## 2013-11-21 DIAGNOSIS — N6009 Solitary cyst of unspecified breast: Secondary | ICD-10-CM | POA: Insufficient documentation

## 2013-11-21 DIAGNOSIS — E669 Obesity, unspecified: Secondary | ICD-10-CM | POA: Insufficient documentation

## 2013-11-21 HISTORY — DX: Personal history of other diseases of the digestive system: Z87.19

## 2013-11-21 HISTORY — DX: Essential (primary) hypertension: I10

## 2013-11-21 HISTORY — DX: Headache, unspecified: R51.9

## 2013-11-21 HISTORY — DX: Solitary cyst of unspecified breast: N60.09

## 2013-11-21 HISTORY — DX: Type 2 diabetes mellitus without complications: E11.9

## 2013-11-21 HISTORY — DX: Headache: R51

## 2013-11-21 HISTORY — DX: Anemia, unspecified: D64.9

## 2013-11-21 LAB — BASIC METABOLIC PANEL
Anion gap: 10 (ref 5–15)
BUN: 8 mg/dL (ref 6–23)
CHLORIDE: 103 meq/L (ref 96–112)
CO2: 25 meq/L (ref 19–32)
Calcium: 9.6 mg/dL (ref 8.4–10.5)
Creatinine, Ser: 0.83 mg/dL (ref 0.50–1.10)
GFR calc Af Amer: >90 mL/min (ref >90)
GFR calc non Af Amer: 85 mL/min — ABNORMAL LOW (ref >90)
GLUCOSE: 91 mg/dL (ref 70–99)
Potassium: 4.4 mEq/L (ref 3.7–5.3)
SODIUM: 138 meq/L (ref 137–147)

## 2013-11-21 LAB — CBC
HEMATOCRIT: 39 % (ref 36.0–46.0)
Hemoglobin: 13.1 g/dL (ref 12.0–15.0)
MCH: 29.8 pg (ref 26.0–34.0)
MCHC: 33.6 g/dL (ref 30.0–36.0)
MCV: 88.8 fL (ref 78.0–100.0)
PLATELETS: 222 10*3/uL (ref 150–400)
RBC: 4.39 MIL/uL (ref 3.87–5.11)
RDW: 14 % (ref 11.5–15.5)
WBC: 4.2 10*3/uL (ref 4.0–10.5)

## 2013-11-21 LAB — TYPE AND SCREEN
ABO/RH(D): O POS
Antibody Screen: NEGATIVE

## 2013-11-21 LAB — ABO/RH: ABO/RH(D): O POS

## 2013-11-21 NOTE — Patient Instructions (Addendum)
Your procedure is scheduled on:  Monday, Oct. 12, 2015  Enter through the Main Entrance of [facility] at:  11:30 a.m.  Pick up the phone at the desk and dial [phone].  Call this number if you have problems the morning of surgery: [phone].  Remember: Do NOT eat food: After midnight Oct. 11th Do NOT drink clear liquids after: After 9:00 a.m. day of surgery Take these medicines the morning of surgery with a SIP OF WATER: None  Do NOT wear jewelry (body piercing), metal hair clips/bobby pins, make-up, or nail polish. Do NOT wear lotions, powders, or perfumes.  You may wear deoderant. Do NOT shave for 48 hours prior to surgery. Do NOT bring valuables to the hospital. Contacts, dentures, or bridgework may not be worn into surgery. Leave suitcase in car.  After surgery it may be brought to your room.  For patients admitted to the hospital, checkout time is 11:00 AM the day of discharge.

## 2013-11-25 MED ORDER — CLINDAMYCIN PHOSPHATE 900 MG/50ML IV SOLN
900.0000 mg | INTRAVENOUS | Status: AC
  Administered 2013-11-26: 900 mg via INTRAVENOUS
  Filled 2013-11-25: qty 50

## 2013-11-25 MED ORDER — CIPROFLOXACIN IN D5W 400 MG/200ML IV SOLN
400.0000 mg | INTRAVENOUS | Status: AC
  Administered 2013-11-26: 400 mg via INTRAVENOUS
  Filled 2013-11-25: qty 200

## 2013-11-26 ENCOUNTER — Encounter (HOSPITAL_COMMUNITY)
Admission: RE | Disposition: A | Discharge status: Home / Self Care | Payer: Self-pay | Source: Ambulatory Visit | Attending: Obstetrics and Gynecology

## 2013-11-26 ENCOUNTER — Encounter (HOSPITAL_COMMUNITY): Payer: BC Managed Care – PPO | Admitting: Certified Registered Nurse Anesthetist

## 2013-11-26 ENCOUNTER — Encounter (HOSPITAL_COMMUNITY): Payer: Self-pay | Admitting: *Deleted

## 2013-11-26 ENCOUNTER — Inpatient Hospital Stay (HOSPITAL_COMMUNITY)
Admission: RE | Admit: 2013-11-26 | Discharge: 2013-11-27 | DRG: 743 | Disposition: A | Discharge status: Home / Self Care | Payer: BC Managed Care – PPO | Source: Ambulatory Visit | Attending: Obstetrics and Gynecology | Admitting: Obstetrics and Gynecology

## 2013-11-26 ENCOUNTER — Inpatient Hospital Stay (HOSPITAL_COMMUNITY): Payer: BC Managed Care – PPO | Admitting: Certified Registered Nurse Anesthetist

## 2013-11-26 DIAGNOSIS — N938 Other specified abnormal uterine and vaginal bleeding: Secondary | ICD-10-CM | POA: Diagnosis present

## 2013-11-26 DIAGNOSIS — Z880 Allergy status to penicillin: Secondary | ICD-10-CM

## 2013-11-26 DIAGNOSIS — Z9889 Other specified postprocedural states: Secondary | ICD-10-CM

## 2013-11-26 DIAGNOSIS — K449 Diaphragmatic hernia without obstruction or gangrene: Secondary | ICD-10-CM | POA: Diagnosis present

## 2013-11-26 DIAGNOSIS — D649 Anemia, unspecified: Secondary | ICD-10-CM | POA: Diagnosis present

## 2013-11-26 DIAGNOSIS — E119 Type 2 diabetes mellitus without complications: Secondary | ICD-10-CM | POA: Diagnosis present

## 2013-11-26 DIAGNOSIS — I10 Essential (primary) hypertension: Secondary | ICD-10-CM | POA: Diagnosis present

## 2013-11-26 DIAGNOSIS — D251 Intramural leiomyoma of uterus: Secondary | ICD-10-CM | POA: Diagnosis present

## 2013-11-26 HISTORY — PX: MYOMECTOMY: SHX85

## 2013-11-26 LAB — PREGNANCY, URINE: Preg Test, Ur: NEGATIVE

## 2013-11-26 SURGERY — MYOMECTOMY, ABDOMINAL APPROACH
Anesthesia: General | Site: Abdomen

## 2013-11-26 MED ORDER — PROPOFOL 10 MG/ML IV BOLUS
INTRAVENOUS | Status: DC | PRN
  Administered 2013-11-26: 200 mg via INTRAVENOUS

## 2013-11-26 MED ORDER — NEOSTIGMINE METHYLSULFATE 10 MG/10ML IV SOLN
INTRAVENOUS | Status: DC | PRN
  Administered 2013-11-26: 5 mg via INTRAVENOUS

## 2013-11-26 MED ORDER — HYDROMORPHONE HCL 1 MG/ML IJ SOLN: 0.2500 mg | INTRAMUSCULAR | Status: DC | PRN

## 2013-11-26 MED ORDER — SCOPOLAMINE 1 MG/3DAYS TD PT72
MEDICATED_PATCH | TRANSDERMAL | Status: AC
  Administered 2013-11-26: 1.5 mg via TRANSDERMAL
  Filled 2013-11-26: qty 1

## 2013-11-26 MED ORDER — LACTATED RINGERS IV SOLN
INTRAVENOUS | Status: DC
  Administered 2013-11-26 (×2): via INTRAVENOUS

## 2013-11-26 MED ORDER — FENTANYL CITRATE 0.05 MG/ML IJ SOLN
INTRAMUSCULAR | Status: AC
  Filled 2013-11-26: qty 5

## 2013-11-26 MED ORDER — LIDOCAINE HCL (CARDIAC) 20 MG/ML IV SOLN
INTRAVENOUS | Status: AC
  Filled 2013-11-26: qty 5

## 2013-11-26 MED ORDER — INFLUENZA VAC SPLIT QUAD 0.5 ML IM SUSY
0.5000 mL | PREFILLED_SYRINGE | INTRAMUSCULAR | Status: DC
  Filled 2013-11-26: qty 0.5

## 2013-11-26 MED ORDER — KETOROLAC TROMETHAMINE 30 MG/ML IJ SOLN
INTRAMUSCULAR | Status: AC
  Filled 2013-11-26: qty 1

## 2013-11-26 MED ORDER — KETOROLAC TROMETHAMINE 30 MG/ML IJ SOLN
INTRAMUSCULAR | Status: DC | PRN
  Administered 2013-11-26: 30 mg via INTRAVENOUS

## 2013-11-26 MED ORDER — GLYCOPYRROLATE 0.2 MG/ML IJ SOLN
INTRAMUSCULAR | Status: DC | PRN
  Administered 2013-11-26: .8 mg via INTRAVENOUS
  Administered 2013-11-26 (×2): 0.1 mg via INTRAVENOUS

## 2013-11-26 MED ORDER — KETOROLAC TROMETHAMINE 30 MG/ML IJ SOLN: 30.0000 mg | Freq: Four times a day (QID) | INTRAMUSCULAR | Status: DC

## 2013-11-26 MED ORDER — BUPIVACAINE HCL (PF) 0.25 % IJ SOLN
INTRAMUSCULAR | Status: AC
Start: 2013-11-26 — End: 2013-11-26
  Filled 2013-11-26: qty 30

## 2013-11-26 MED ORDER — PROPOFOL 10 MG/ML IV EMUL
INTRAVENOUS | Status: AC
  Filled 2013-11-26: qty 20

## 2013-11-26 MED ORDER — DOCUSATE SODIUM 100 MG PO CAPS
100.0000 mg | ORAL_CAPSULE | Freq: Two times a day (BID) | ORAL | Status: DC
  Administered 2013-11-26 – 2013-11-27 (×2): 100 mg via ORAL
  Filled 2013-11-26 (×2): qty 1

## 2013-11-26 MED ORDER — KETOROLAC TROMETHAMINE 30 MG/ML IJ SOLN: 15.0000 mg | Freq: Once | INTRAMUSCULAR | Status: DC | PRN

## 2013-11-26 MED ORDER — ONDANSETRON HCL 4 MG/2ML IJ SOLN
INTRAMUSCULAR | Status: AC
  Filled 2013-11-26: qty 2

## 2013-11-26 MED ORDER — OXYCODONE-ACETAMINOPHEN 5-325 MG PO TABS
1.0000 | ORAL_TABLET | ORAL | Status: DC | PRN
  Administered 2013-11-27: 1 via ORAL
  Filled 2013-11-26: qty 1

## 2013-11-26 MED ORDER — ROCURONIUM BROMIDE 100 MG/10ML IV SOLN
INTRAVENOUS | Status: AC
  Filled 2013-11-26: qty 1

## 2013-11-26 MED ORDER — SIMETHICONE 80 MG PO CHEW: 80.0000 mg | CHEWABLE_TABLET | Freq: Four times a day (QID) | ORAL | Status: DC | PRN

## 2013-11-26 MED ORDER — NEOSTIGMINE METHYLSULFATE 10 MG/10ML IV SOLN
INTRAVENOUS | Status: AC
  Filled 2013-11-26: qty 1

## 2013-11-26 MED ORDER — GLYCOPYRROLATE 0.2 MG/ML IJ SOLN
INTRAMUSCULAR | Status: AC
  Filled 2013-11-26: qty 1

## 2013-11-26 MED ORDER — NALOXONE NEWBORN-WH INJECTION 0.4 MG/ML
INTRAMUSCULAR | Status: DC | PRN
  Administered 2013-11-26: .02 mg via INTRAVENOUS

## 2013-11-26 MED ORDER — KETOROLAC TROMETHAMINE 30 MG/ML IJ SOLN
30.0000 mg | Freq: Four times a day (QID) | INTRAMUSCULAR | Status: DC
  Administered 2013-11-26 – 2013-11-27 (×2): 30 mg via INTRAVENOUS
  Filled 2013-11-26 (×2): qty 1

## 2013-11-26 MED ORDER — BUPIVACAINE HCL (PF) 0.25 % IJ SOLN
INTRAMUSCULAR | Status: AC
  Filled 2013-11-26: qty 30

## 2013-11-26 MED ORDER — MEPERIDINE HCL 25 MG/ML IJ SOLN: 6.2500 mg | INTRAMUSCULAR | Status: DC | PRN

## 2013-11-26 MED ORDER — LIDOCAINE HCL (CARDIAC) 20 MG/ML IV SOLN
INTRAVENOUS | Status: DC | PRN
  Administered 2013-11-26: 100 mg via INTRAVENOUS

## 2013-11-26 MED ORDER — ONDANSETRON HCL 4 MG PO TABS: 4.0000 mg | ORAL_TABLET | Freq: Four times a day (QID) | ORAL | Status: DC | PRN

## 2013-11-26 MED ORDER — MIDAZOLAM HCL 2 MG/2ML IJ SOLN
INTRAMUSCULAR | Status: AC
  Filled 2013-11-26: qty 2

## 2013-11-26 MED ORDER — SCOPOLAMINE 1 MG/3DAYS TD PT72
1.0000 | MEDICATED_PATCH | Freq: Once | TRANSDERMAL | Status: AC
  Administered 2013-11-26: 1 via TRANSDERMAL
  Administered 2013-11-26: 1.5 mg via TRANSDERMAL

## 2013-11-26 MED ORDER — ROCURONIUM BROMIDE 100 MG/10ML IV SOLN
INTRAVENOUS | Status: DC | PRN
  Administered 2013-11-26: 80 mg via INTRAVENOUS

## 2013-11-26 MED ORDER — HYDROMORPHONE HCL 1 MG/ML IJ SOLN
INTRAMUSCULAR | Status: DC | PRN
  Administered 2013-11-26: 2 mg via INTRAVENOUS

## 2013-11-26 MED ORDER — ONDANSETRON HCL 4 MG/2ML IJ SOLN
INTRAMUSCULAR | Status: DC | PRN
  Administered 2013-11-26 (×2): 2 mg via INTRAVENOUS

## 2013-11-26 MED ORDER — PROMETHAZINE HCL 25 MG/ML IJ SOLN: 6.2500 mg | INTRAMUSCULAR | Status: DC | PRN

## 2013-11-26 MED ORDER — BUPIVACAINE HCL (PF) 0.25 % IJ SOLN
INTRAMUSCULAR | Status: DC | PRN
  Administered 2013-11-26: 6 mL

## 2013-11-26 MED ORDER — SODIUM CHLORIDE 0.9 % IJ SOLN
INTRAMUSCULAR | Status: AC
  Filled 2013-11-26: qty 100

## 2013-11-26 MED ORDER — MIDAZOLAM HCL 2 MG/2ML IJ SOLN
INTRAMUSCULAR | Status: DC | PRN
  Administered 2013-11-26: 1.5 mg via INTRAVENOUS
  Administered 2013-11-26: 0.5 mg via INTRAVENOUS

## 2013-11-26 MED ORDER — ONDANSETRON HCL 4 MG/2ML IJ SOLN: 4.0000 mg | Freq: Four times a day (QID) | INTRAMUSCULAR | Status: DC | PRN

## 2013-11-26 MED ORDER — NALOXONE HCL 0.4 MG/ML IJ SOLN
INTRAMUSCULAR | Status: AC
  Filled 2013-11-26: qty 1

## 2013-11-26 MED ORDER — HYDROMORPHONE HCL 1 MG/ML IJ SOLN
INTRAMUSCULAR | Status: AC
  Filled 2013-11-26: qty 1

## 2013-11-26 MED ORDER — DEXTROSE IN LACTATED RINGERS 5 % IV SOLN
INTRAVENOUS | Status: DC
  Administered 2013-11-26: 22:00:00 via INTRAVENOUS

## 2013-11-26 MED ORDER — DEXTROSE IN LACTATED RINGERS 5 % IV SOLN: INTRAVENOUS | Status: DC

## 2013-11-26 MED ORDER — DEXAMETHASONE SODIUM PHOSPHATE 10 MG/ML IJ SOLN
INTRAMUSCULAR | Status: DC | PRN
  Administered 2013-11-26: 5 mg via INTRAVENOUS

## 2013-11-26 MED ORDER — PANTOPRAZOLE SODIUM 40 MG PO TBEC
40.0000 mg | DELAYED_RELEASE_TABLET | Freq: Every day | ORAL | Status: DC
  Administered 2013-11-27: 40 mg via ORAL
  Filled 2013-11-26: qty 1

## 2013-11-26 MED ORDER — MIDAZOLAM HCL 2 MG/2ML IJ SOLN: 0.5000 mg | Freq: Once | INTRAMUSCULAR | Status: DC | PRN

## 2013-11-26 MED ORDER — DEXAMETHASONE SODIUM PHOSPHATE 10 MG/ML IJ SOLN
INTRAMUSCULAR | Status: AC
  Filled 2013-11-26: qty 1

## 2013-11-26 MED ORDER — VASOPRESSIN 20 UNIT/ML IJ SOLN
INTRAVENOUS | Status: DC | PRN
  Administered 2013-11-26: 15:00:00 via INTRAMUSCULAR

## 2013-11-26 MED ORDER — MENTHOL 3 MG MT LOZG: 1.0000 | LOZENGE | OROMUCOSAL | Status: DC | PRN

## 2013-11-26 MED ORDER — FENTANYL CITRATE 0.05 MG/ML IJ SOLN
INTRAMUSCULAR | Status: DC | PRN
  Administered 2013-11-26: 100 ug via INTRAVENOUS
  Administered 2013-11-26: 50 ug via INTRAVENOUS
  Administered 2013-11-26: 100 ug via INTRAVENOUS

## 2013-11-26 MED ORDER — TRIAMTERENE-HCTZ 37.5-25 MG PO TABS
1.0000 | ORAL_TABLET | Freq: Every day | ORAL | Status: DC
  Filled 2013-11-26 (×3): qty 1

## 2013-11-26 MED ORDER — IBUPROFEN 800 MG PO TABS
800.0000 mg | ORAL_TABLET | Freq: Three times a day (TID) | ORAL | Status: DC | PRN
Start: 2013-11-26 — End: 2013-11-27
  Administered 2013-11-27: 800 mg via ORAL
  Filled 2013-11-26: qty 1

## 2013-11-26 MED ORDER — GLYCOPYRROLATE 0.2 MG/ML IJ SOLN
INTRAMUSCULAR | Status: AC
  Filled 2013-11-26: qty 3

## 2013-11-26 MED ORDER — HYDROMORPHONE HCL 1 MG/ML IJ SOLN
0.2000 mg | INTRAMUSCULAR | Status: DC | PRN
  Administered 2013-11-26: 0.5 mg via INTRAVENOUS
  Filled 2013-11-26: qty 1

## 2013-11-26 SURGICAL SUPPLY — 47 items
BARRIER ADHS 3X4 INTERCEED (GAUZE/BANDAGES/DRESSINGS) ×4 IMPLANT
BRR ADH 4X3 ABS CNTRL BYND (GAUZE/BANDAGES/DRESSINGS) ×2
CANISTER SUCT 3000ML (MISCELLANEOUS) ×3 IMPLANT
CATH FOLEY 2WAY  3CC  8FR (CATHETERS)
CATH FOLEY 2WAY 3CC 8FR (CATHETERS) IMPLANT
CLOTH BEACON ORANGE TIMEOUT ST (SAFETY) ×3 IMPLANT
CONT PATH 16OZ SNAP LID 3702 (MISCELLANEOUS) ×3 IMPLANT
CONT SPEC PATH 64OZ SNAP LID (MISCELLANEOUS) ×3 IMPLANT
DECANTER SPIKE VIAL GLASS SM (MISCELLANEOUS) ×3 IMPLANT
DRAPE CESAREAN BIRTH W POUCH (DRAPES) ×3 IMPLANT
DRSG OPSITE POSTOP 4X10 (GAUZE/BANDAGES/DRESSINGS) ×2 IMPLANT
ELECT NDL TIP 2.8 STRL (NEEDLE) ×1 IMPLANT
ELECT NEEDLE TIP 2.8 STRL (NEEDLE) ×3 IMPLANT
FILTER STRAW FLUID ASPIR (MISCELLANEOUS) IMPLANT
GAUZE SPONGE 4X4 16PLY XRAY LF (GAUZE/BANDAGES/DRESSINGS) ×3 IMPLANT
GLOVE BIOGEL PI IND STRL 7.0 (GLOVE) ×1 IMPLANT
GLOVE BIOGEL PI INDICATOR 7.0 (GLOVE) ×2
GLOVE ECLIPSE 6.5 STRL STRAW (GLOVE) ×3 IMPLANT
GOWN STRL REUS W/TWL LRG LVL3 (GOWN DISPOSABLE) ×9 IMPLANT
IV STOPCOCK 4 WAY 40  W/Y SET (IV SOLUTION)
IV STOPCOCK 4 WAY 40 W/Y SET (IV SOLUTION) IMPLANT
NDL HYPO 25X1 1.5 SAFETY (NEEDLE) ×1 IMPLANT
NEEDLE HYPO 25X1 1.5 SAFETY (NEEDLE) ×3 IMPLANT
NEEDLE SPNL 22GX9.0 ACCUTG (NEEDLE) IMPLANT
NS IRRIG 1000ML POUR BTL (IV SOLUTION) ×3 IMPLANT
PACK ABDOMINAL GYN (CUSTOM PROCEDURE TRAY) ×3 IMPLANT
PAD OB MATERNITY 4.3X12.25 (PERSONAL CARE ITEMS) ×3 IMPLANT
RTRCTR C-SECT PINK 25CM LRG (MISCELLANEOUS) ×2 IMPLANT
SPONGE GAUZE 4X4 12PLY STER LF (GAUZE/BANDAGES/DRESSINGS) ×3 IMPLANT
STAPLER VISISTAT 35W (STAPLE) ×2 IMPLANT
SUT MNCRL AB 4-0 PS2 18 (SUTURE) ×6 IMPLANT
SUT MON AB 3-0 SH 27 (SUTURE) ×24
SUT MON AB 3-0 SH27 (SUTURE) IMPLANT
SUT PLAIN 2 0 XLH (SUTURE) ×2 IMPLANT
SUT VIC AB 0 CT1 18XCR BRD8 (SUTURE) ×3 IMPLANT
SUT VIC AB 0 CT1 36 (SUTURE) ×6 IMPLANT
SUT VIC AB 0 CT1 8-18 (SUTURE) ×9
SUT VIC AB 2-0 CT1 27 (SUTURE) ×3
SUT VIC AB 2-0 CT1 TAPERPNT 27 (SUTURE) ×1 IMPLANT
SUT VIC AB 2-0 SH 27 (SUTURE) ×12
SUT VIC AB 2-0 SH 27XBRD (SUTURE) ×4 IMPLANT
SUT VIC AB 4-0 PS2 27 (SUTURE) IMPLANT
SYR 50ML LL SCALE MARK (SYRINGE) IMPLANT
SYR CONTROL 10ML LL (SYRINGE) ×3 IMPLANT
TOWEL OR 17X24 6PK STRL BLUE (TOWEL DISPOSABLE) ×6 IMPLANT
TRAY FOLEY CATH 14FR (SET/KITS/TRAYS/PACK) ×3 IMPLANT
WATER STERILE IRR 1000ML POUR (IV SOLUTION) ×3 IMPLANT

## 2013-11-26 NOTE — Anesthesia Preprocedure Evaluation (Addendum)
Anesthesia Evaluation  Patient identified by MRN, date of birth, ID band Patient awake    Reviewed: Allergy & Precautions, H&P , Patient's Chart, lab work & pertinent test results, reviewed documented beta blocker date and time   History of Anesthesia Complications Negative for: history of anesthetic complications  Airway Mallampati: II TM Distance: >3 FB Neck ROM: full    Dental   Pulmonary  breath sounds clear to auscultation        Cardiovascular Exercise Tolerance: Good hypertension, Rhythm:regular Rate:Normal     Neuro/Psych  Headaches,    GI/Hepatic hiatal hernia,   Endo/Other  diabetes  Renal/GU      Musculoskeletal   Abdominal   Peds  Hematology  (+) anemia ,   Anesthesia Other Findings   Reproductive/Obstetrics                          Anesthesia Physical Anesthesia Plan  ASA: III  Anesthesia Plan: General ETT   Post-op Pain Management:    Induction:   Airway Management Planned:   Additional Equipment:   Intra-op Plan:   Post-operative Plan:   Informed Consent: I have reviewed the patients History and Physical, chart, labs and discussed the procedure including the risks, benefits and alternatives for the proposed anesthesia with the patient or authorized representative who has indicated his/her understanding and acceptance.   Dental Advisory Given  Plan Discussed with: CRNA and Surgeon  Anesthesia Plan Comments:        2 PIVs Anesthesia Quick Evaluation

## 2013-11-26 NOTE — Anesthesia Postprocedure Evaluation (Signed)
  Anesthesia Post Note  Patient: Alexa Savage  Procedure(s) Performed: Procedure(s) (LRB): Exploratory Laparotomy MYOMECTOMY (N/A)  Anesthesia type: GA  Patient location: PACU  Post pain: Pain level controlled  Post assessment: Post-op Vital signs reviewed  Last Vitals:  Filed Vitals:   11/26/13 1615  BP: 127/79  Pulse: 71  Temp:   Resp: 6    Post vital signs: Reviewed  Level of consciousness: sedated  Complications: No apparent anesthesia complications; prolonged emergence likely 2/2 narcotics

## 2013-11-26 NOTE — Brief Op Note (Signed)
11/26/2013  3:36 PM  PATIENT:  Alexa Savage  43 y.o. female  PRE-OPERATIVE DIAGNOSIS:  Fibroid Uterus, Dysfunctional Uterine Bleeding  POST-OPERATIVE DIAGNOSIS: Degenerating torsed pedunculated fibroid, Intramural fibroids , dysfuctional uterine bleeding  PROCEDURE:  Procedure(s): Exploratory Laparotomy MYOMECTOMY (N/A)  SURGEON:  Surgeon(s) and Role:    * Roneka Gilpin Clint Bolder, MD - Primary  PHYSICIAN ASSISTANT:   ASSISTANTS: Laury Deep, CNM   ANESTHESIA:   general  FINDINGS; nl tubes and ovaries, nl appendix, nl liver edge, full gallbladder, fluid in peritoneal cavity, torsed left fundal degenerating 10 cm fibroid, post and anterior intramural fibroids, left small fibroid proximal to left ovarian ligament EBL:  Total I/O In: 1000 [I.V.:1000] Out: 450 [Urine:400; Blood:50]  BLOOD ADMINISTERED:none  DRAINS: none   LOCAL MEDICATIONS USED:  MARCAINE     SPECIMEN:  Source of Specimen:  myomas  DISPOSITION OF SPECIMEN:  PATHOLOGY  COUNTS:  YES  TOURNIQUET:  * No tourniquets in log *  DICTATION: .Other Dictation: Dictation Number [id number]  PLAN OF CARE: Admit to inpatient   PATIENT DISPOSITION:  PACU - hemodynamically stable.   Delay start of Pharmacological VTE agent (>24hrs) due to surgical blood loss or risk of bleeding: no

## 2013-11-27 ENCOUNTER — Encounter (HOSPITAL_COMMUNITY): Payer: Self-pay | Admitting: Obstetrics and Gynecology

## 2013-11-27 LAB — CBC
HEMATOCRIT: 34.1 % — AB (ref 36.0–46.0)
Hemoglobin: 11.1 g/dL — ABNORMAL LOW (ref 12.0–15.0)
MCH: 29.2 pg (ref 26.0–34.0)
MCHC: 32.6 g/dL (ref 30.0–36.0)
MCV: 89.7 fL (ref 78.0–100.0)
PLATELETS: 221 10*3/uL (ref 150–400)
RBC: 3.8 MIL/uL — ABNORMAL LOW (ref 3.87–5.11)
RDW: 14.1 % (ref 11.5–15.5)
WBC: 9.7 10*3/uL (ref 4.0–10.5)

## 2013-11-27 MED ORDER — IBUPROFEN 800 MG PO TABS: 800.0000 mg | ORAL_TABLET | Freq: Three times a day (TID) | ORAL | Status: DC | PRN

## 2013-11-27 MED ORDER — OXYCODONE-ACETAMINOPHEN 5-325 MG PO TABS: 1.0000 | ORAL_TABLET | ORAL | Status: DC | PRN

## 2013-11-27 NOTE — Progress Notes (Signed)
Pt. Is discharged in the care of Mother. Downstair per ambulatory. Stable. Abdominal incisions are clean and dry.Understands all discharge  instructions  Well. No equipment needed for home use.

## 2013-11-27 NOTE — Discharge Summary (Signed)
Physician Discharge Summary  Patient ID: Alexa Savage MRN: [id number] DOB/AGE: [birth year] 43 y.o.  Admit date: 11/26/2013 Discharge date: 11/27/2013  Admission Diagnoses: uterine fibroids, dub  Discharge Diagnoses: torsed pedunculated fibroid, IM fibroids, DUB  Active Problems:   S/P myomectomy   Discharged Condition: stable  Hospital Course: pt was admitted to [facility] where she underwent exploratory laparotomy, myomectomy. Pt had uncomplicated postoperative course  Consults: None  Significant Diagnostic Studies: labs:  CBC Latest Ref Rng 11/27/2013 11/21/2013 06/26/2013  WBC 4.0 - 10.5 K/uL 9.7 4.2 3.4(L)  Hemoglobin 12.0 - 15.0 g/dL 11.1(L) 13.1 12.7  Hematocrit 36.0 - 46.0 % 34.1(L) 39.0 39.6  Platelets 150 - 400 K/uL 221 222 249      Treatments: surgery: exploratory laparotomy, myomectomy  Discharge Exam: Blood pressure 119/66, pulse 85, temperature 98.6 F (37 C), temperature source Oral, resp. rate 16, height 5\' 5"  (1.651 m), weight 198 lb (89.812 kg), SpO2 99.00%. General appearance: alert, cooperative and no distress Resp: clear to auscultation bilaterally Cardio: regular rate and rhythm, S1, S2 normal, no murmur, click, rub or gallop GI: soft, non-tender; bowel sounds normal; no masses,  no organomegaly Pelvic: deferred Extremities: no edema, redness or tenderness in the calves or thighs Skin: Skin color, texture, turgor normal. No rashes or lesions Incision/Wound: dressing intact/blood stained on lateral edge  Disposition: Final discharge disposition not confirmed  Discharge Instructions   Diet - low sodium heart healthy    Complete by:  As directed      Discharge instructions    Complete by:  As directed   Call if temperature greater than equal to 100.4, nothing per vagina for 4-6 weeks or severe nausea vomiting, increased incisional pain , drainage or redness in the incision site, no straining with bowel movements, showers no bath     Discharge patient     Complete by:  As directed      Increase activity slowly    Complete by:  As directed      No wound care    Complete by:  As directed             Medication List         ferrous sulfate 325 (65 FE) MG tablet  Take 325 mg by mouth daily with breakfast.     ibuprofen 800 MG tablet  Commonly known as:  ADVIL,MOTRIN  Take 1 tablet (800 mg total) by mouth every 8 (eight) hours as needed (mild pain).     loratadine 10 MG tablet  Commonly known as:  CLARITIN  Take 10 mg by mouth daily as needed for allergies.     multivitamin with minerals tablet  Take 1 tablet by mouth daily.     oxyCODONE-acetaminophen 5-325 MG per tablet  Commonly known as:  PERCOCET/ROXICET  Take 1-2 tablets by mouth every 4 (four) hours as needed for severe pain (moderate to severe pain (when tolerating fluids)).     triamterene-hydrochlorothiazide 37.5-25 MG per tablet  Commonly known as:  MAXZIDE-25  Take 1 tablet by mouth daily.     VITAMIN A PO  Take 1 tablet by mouth daily.           Follow-up Information   Follow up with Cassara Nida A, MD. (staple removal on Monday)    Specialty:  Obstetrics and Gynecology   Contact information:   [address] [place] [address] [phone]       Follow up with Terin Dierolf A, MD In 4 weeks.   Specialty:  Obstetrics and Gynecology   Contact information:   [address] [place] [address] [phone]       Signed: Servando Salina A 11/27/2013, 11:54 AM

## 2013-11-27 NOTE — Op Note (Signed)
Alexa Savage, TOLSMA NO.:  [id number]  MEDICAL RECORD NO.:  [id number]  LOCATION:  9320                          FACILITY:  [facility]  PHYSICIAN:  Servando Salina, M.D.DATE OF BIRTH:  Jul 27, 1970  DATE OF PROCEDURE:  11/26/2013 DATE OF DISCHARGE:                              OPERATIVE REPORT   PREOPERATIVE DIAGNOSIS:  Fibroid uterus, dysfunctional uterine bleeding.  POSTOPERATIVE DIAGNOSIS:  Degenerating torsed pedunculated fibroid, intramural fibroids, dysfunctional uterine bleeding.  PROCEDURE:  Exploratory laparotomy, multiple myomectomy.  ANESTHESIA:  General.  SURGEON:  Servando Salina, M.D.  ASSISTANT:  Laury Deep, CNM  PROCEDURE IN DETAIL:  Under adequate general anesthesia, the patient was placed in the supine position.  Examination under anesthesia revealed a large right mobile pedunculated mass and a uterus that was about 12-13 weeks size.  The patient was sterilely prepped and draped in the usual fashion.  Indwelling Foley catheter was sterilely placed.  A 0.25% Marcaine was injected along the planned Pfannenstiel skin incision site. Pfannenstiel skin incision was then made, carried down to the rectus fascia using cautery.  The rectus fascia was opened transversely.  The rectus fascia was then bluntly and sharply dissected off the rectus muscle in superior and inferior fashion.  The rectus muscle was split in midline.  The parietal peritoneum was entered bluntly and extended.  On opening the abdominal cavity, lightly blood-tinged serous fluid was encountered which was suctioned.  There was a pale large fibroid noted on exploration of the abdomen.  The liver edge was normal, palpable taut gallbladder, appendix was normal.  The pedunculated fibroid was grasped and the uterus was exteriorized.  It was then noted that this fibroid which was noted to be right on exam both abdominally and bimanually was actually left large pedunculated fibroid  which appeared to be degenerating that was torsed and twisted the uterus to the right.  An anterior and posterior intramural  fibroid was encountered and a smaller left pedunculated fibroid at the proximal edge of the left ovarian ligament was noted. Both tubes and ovaries were noted to be normal.  Otherwise, the uterus was unremarkable.  The posterior cul-de-sac was without any lesions. The Alexis retractor was then placed.  The bowels was then packed upwardly.  A dilute solution of Pitressin was injected in the anterior and posterior aspect of the uterus overlying the fibroids.  Vertical incisions were made in the serosal surface  of both anterior and posterior fibroids and the fibroids were enucleated. Both fibroids were soft but well delineated. Palpation of the uterus through those anterior and posterior incision did not reveal any other dominant fibroids, therefore the cavity for both fibroid defect were closed with 0 Vicryl figure-of-eight sutures until the serosal surface was reached at which time 3-0 Monocryl sutures were used in a baseball fashion to close the serosa anteriorly and posteriorly.  Attention was then turned to both pedunculated fibroids whose bases were both injected with dilute solution of Pitressin and subsequently removed.  The defect was closed with 3-0 Monocryl for this serosal surface and 0 Vicryl figure-of-eight suture for the deeper surface closure with the serosal surface of the uterus.  The abdomen was then irrigated and suctioned.  The bowel packings were removed, the Alexis retractor was then removed.  Interceed was carefully placed on the anterior and posterior incisions and the uterus was returned back to the abdomen.  The parietal peritoneum was then closed with 2-0 Vicryl.  The rectus fascia was closed with 0 Vicryl x2.  The subcutaneous area which was deep was closed with interrupted 2-0 plain sutures and the skin approximated with Ethicon staples.   Specimen was myomas x5 weighing over 400 g.  ESTIMATED BLOOD LOSS:  50 mL.  INTRAOPERATIVE FLUIDS:  1800 mL.  URINE OUTPUT:  400 mL.  COUNTS:  Sponge and instrument counts x2 was correct.  COMPLICATION:  None.  The patient tolerated the procedure well and was transferred to recovery in stable condition.     Servando Salina, M.D.     [place]/MEDQ  D:  11/27/2013  T:  11/27/2013  Job:  [id number]

## 2013-11-27 NOTE — Transfer of Care (Signed)
Immediate Anesthesia Transfer of Care Note  Patient: Alexa Savage  Procedure(s) Performed: Procedure(s): Exploratory Laparotomy MYOMECTOMY (N/A)  Patient Location: PACU  Anesthesia Type:General  Level of Consciousness: awake, alert  and oriented  Airway & Oxygen Therapy: Patient Spontanous Breathing and Patient connected to nasal cannula oxygen  Post-op Assessment: Report given to PACU RN and Post -op Vital signs reviewed and stable  Post vital signs: Reviewed and stable  Complications: No apparent anesthesia complications

## 2013-11-27 NOTE — Progress Notes (Signed)
Subjective: Patient reports tolerating PO, + flatus and no problems voiding.    Objective: I have reviewed patient's vital signs.  vital signs, intake and output and labs. Filed Vitals:   11/27/13 1002  BP: 119/66  Pulse: 85  Temp: 98.6 F (37 C)  Resp: 16   I/O last 3 completed shifts: In: 3756.7 [P.O.:440; I.V.:3316.7] Out: 1325 [Urine:1275; Blood:50]   Path pending Lab Results  Component Value Date   WBC 9.7 11/27/2013   HGB 11.1* 11/27/2013   HCT 34.1* 11/27/2013   MCV 89.7 11/27/2013   PLT 221 11/27/2013   Lab Results  Component Value Date   CREATININE 0.83 11/21/2013    EXAM General: alert, cooperative and no distress Resp: clear to auscultation bilaterally Cardio: regular rate and rhythm, S1, S2 normal, no murmur, click, rub or gallop GI: soft, non-tender; bowel sounds normal; no masses,  no organomegaly and incision: intact and bloody and on left side of dressing drainage present Extremities: no edema, redness or tenderness in the calves or thighs Vaginal Bleeding: none  Assessment: s/p Procedure(s): Exploratory Laparotomy MYOMECTOMY: stable, progressing well and tolerating diet  Plan: Encourage ambulation Discontinue IV fluids Discharge home D/c instructions reviewed. Staple removal next Monday  LOS: 1 day    Jeanett Antonopoulos A, MD 11/27/2013 11:38 AM    11/27/2013, 11:38 AM

## 2013-11-27 NOTE — Discharge Instructions (Signed)
Call if temperature greater than equal to 100.4, nothing per vagina for 4-6 weeks or severe nausea vomiting, increased incisional pain , drainage or redness in the incision site, no straining with bowel movements, showers no bath °

## 2013-11-30 ENCOUNTER — Other Ambulatory Visit: Payer: Self-pay

## 2014-02-22 ENCOUNTER — Encounter: Payer: Self-pay | Admitting: Family Medicine

## 2014-02-22 ENCOUNTER — Ambulatory Visit (INDEPENDENT_AMBULATORY_CARE_PROVIDER_SITE_OTHER): Payer: BC Managed Care – PPO | Admitting: Family Medicine

## 2014-02-22 VITALS — BP 140/90 | HR 89 | Temp 98.5°F | Resp 16 | Ht 65.0 in | Wt 201.0 lb

## 2014-02-22 DIAGNOSIS — R7303 Prediabetes: Secondary | ICD-10-CM

## 2014-02-22 DIAGNOSIS — R7309 Other abnormal glucose: Secondary | ICD-10-CM

## 2014-02-22 DIAGNOSIS — J302 Other seasonal allergic rhinitis: Secondary | ICD-10-CM

## 2014-02-22 DIAGNOSIS — Z79899 Other long term (current) drug therapy: Secondary | ICD-10-CM

## 2014-02-22 DIAGNOSIS — I10 Essential (primary) hypertension: Secondary | ICD-10-CM

## 2014-02-22 DIAGNOSIS — J018 Other acute sinusitis: Secondary | ICD-10-CM

## 2014-02-22 DIAGNOSIS — E669 Obesity, unspecified: Secondary | ICD-10-CM

## 2014-02-22 DIAGNOSIS — J019 Acute sinusitis, unspecified: Secondary | ICD-10-CM | POA: Insufficient documentation

## 2014-02-22 MED ORDER — AZITHROMYCIN 250 MG PO TABS: ORAL_TABLET | ORAL | Status: DC

## 2014-02-22 MED ORDER — MOMETASONE FUROATE 50 MCG/ACT NA SUSP: 2.0000 | Freq: Every day | NASAL | Status: DC

## 2014-02-22 NOTE — Assessment & Plan Note (Signed)
Uncontrolled, start daily nasonex and continue claritin , saline flushes to be added

## 2014-02-22 NOTE — Assessment & Plan Note (Signed)
Elevated but has been taking sudafed 4 tabs daily for past 3 days DASH diet and commitment to daily physical activity for a minimum of 30 minutes discussed and encouraged, as a part of hypertension management. The importance of attaining a healthy weight is also discussed.

## 2014-02-22 NOTE — Patient Instructions (Addendum)
F/u in 4 months, May 16 or after  You have uncontrolled allergy symptoms, please commit to daily nasonex and claritin and nasal flushes with saline  Z pack prescribed for use ONLY if symptoms worsen, as in fever, chil;ls, green drainage etc as discussed  Thankful surgery successful  Encouraged to write down health goals, and the plan and give yourself rewards as they are fullfilled  Weight loss goal of 6 to 10 pounds  Fasting CBC, lipids, chem 7 , HBA1C and TSh May 13 or after  All the best for 2016!

## 2014-02-22 NOTE — Progress Notes (Signed)
Subjective:    Patient ID: Alexa Savage, female    DOB: [birth year], 44 y.o.   MRN: [id number]  HPI The PT is here for follow up and re-evaluation of chronic medical conditions, medication management and review of any available recent lab and radiology data.  Preventive health is updated, specifically  Cancer screening and Immunization.   Recently had myomectomy with no complications, at times area stil a bit sore The PT denies any adverse reactions to current medications since the last visit.  3 day h/o excess nasal congestion and sinus pressure, clear to yellow nasal drainage worse last nigth , could hardly breathe, no fever or chills, but "feels sick" Has not commited to daily use of allergy meds, but needs to do so as she is aware that at this time of the year esp she has uncontrolled allergy symptoms. She has been taking excess sudafed for symptom relief Ready to re commit to healthy l;ifestyle changes    Review of Systems See HPI  Denies chest pains, palpitations and leg swelling Denies abdominal pain, nausea, vomiting,diarrhea or constipation.   Denies dysuria, frequency, hesitancy or incontinence. Denies joint pain, swelling and limitation in mobility. Denies headaches, seizures, numbness, or tingling. Denies depression, anxiety or insomnia. Denies skin break down or rash.        Objective:   Physical Exam BP 140/90 mmHg  Pulse 89  Temp(Src) 98.5 F (36.9 C) (Oral)  Resp 16  Ht 5\' 5"  (1.651 m)  Wt 201 lb (91.173 kg)  BMI 33.45 kg/m2  SpO2 96% Patient alert and oriented and in no cardiopulmonary distress.  HEENT: No facial asymmetry, EOMI,   oropharynx pink and moist.  Neck supple no JVD, no mass. Nasal mucosa erythematous and edematous , TM clear , No sinus tenderness Chest: Clear to auscultation bilaterally.  CVS: S1, S2 no murmurs, no S3.Regular rate.  ABD: Soft non tender.   Ext: No edema  MS: Adequate ROM spine, shoulders, hips and  knees.  Skin: Intact, no ulcerations or rash noted.  Psych: Good eye contact, normal affect. Memory intact not anxious or depressed appearing.  CNS: CN 2-12 intact, power,  normal throughout.no focal deficits noted.        Assessment & Plan:  HTN (hypertension) Elevated but has been taking sudafed 4 tabs daily for past 3 days DASH diet and commitment to daily physical activity for a minimum of 30 minutes discussed and encouraged, as a part of hypertension management. The importance of attaining a healthy weight is also discussed.    Seasonal allergies Uncontrolled, start daily nasonex and continue claritin , saline flushes to be added   Sinusitis, acute Pack prescribed only for use if symptoms worsen, pt understands that currently no indication for antibiotic use   Prediabetes Patient educated about the importance of limiting  Carbohydrate intake , the need to commit to daily physical activity for a minimum of 30 minutes , and to commit weight loss. The fact that changes in all these areas will reduce or eliminate all together the development of diabetes is stressed.   Updated lab needed at/ before next visit.     Obesity (BMI 30.0-34.9) Deteriorated. Patient re-educated about  the importance of commitment to a  minimum of 150 minutes of exercise per week. The importance of healthy food choices with portion control discussed. Encouraged to start a food diary, count calories and to consider  joining a support group. Sample diet sheets offered. Goals set by the patient for  the next several months.

## 2014-03-10 NOTE — Assessment & Plan Note (Signed)
Patient educated about the importance of limiting  Carbohydrate intake , the need to commit to daily physical activity for a minimum of 30 minutes , and to commit weight loss. The fact that changes in all these areas will reduce or eliminate all together the development of diabetes is stressed.   Updated lab needed at/ before next visit.  

## 2014-03-10 NOTE — Assessment & Plan Note (Signed)
Pack prescribed only for use if symptoms worsen, pt understands that currently no indication for antibiotic use

## 2014-03-10 NOTE — Assessment & Plan Note (Signed)
Deteriorated. Patient re-educated about  the importance of commitment to a  minimum of 150 minutes of exercise per week. The importance of healthy food choices with portion control discussed. Encouraged to start a food diary, count calories and to consider  joining a support group. Sample diet sheets offered. Goals set by the patient for the next several months.    

## 2014-07-01 ENCOUNTER — Telehealth: Payer: Self-pay

## 2014-07-01 NOTE — Telephone Encounter (Signed)
Found a small tick on her left leg behind her knee and took it off and today it looks like a mosquito bite and itches really bad. Wants something sent for it.  [phone]

## 2014-07-01 NOTE — Telephone Encounter (Signed)
Advise use oTC benadryl cream for itch and kleep area  Clean and dry. May also use benadryl tablet 25 mg at bedtime

## 2014-07-02 NOTE — Telephone Encounter (Signed)
Patient aware.

## 2014-07-05 ENCOUNTER — Ambulatory Visit: Payer: BC Managed Care – PPO | Admitting: Family Medicine

## 2014-07-07 LAB — BASIC METABOLIC PANEL
BUN: 12 mg/dL (ref 6–23)
CO2: 20 meq/L (ref 19–32)
Calcium: 9.7 mg/dL (ref 8.4–10.5)
Chloride: 105 mEq/L (ref 96–112)
Creat: 0.91 mg/dL (ref 0.50–1.10)
Glucose, Bld: 85 mg/dL (ref 70–99)
Potassium: 4.4 mEq/L (ref 3.5–5.3)
Sodium: 141 mEq/L (ref 135–145)

## 2014-07-07 LAB — CBC WITH DIFFERENTIAL/PLATELET
BASOS ABS: 0 10*3/uL (ref 0.0–0.1)
Basophils Relative: 1 % (ref 0–1)
Eosinophils Absolute: 0 10*3/uL (ref 0.0–0.7)
Eosinophils Relative: 1 % (ref 0–5)
HEMATOCRIT: 40.3 % (ref 36.0–46.0)
Hemoglobin: 13 g/dL (ref 12.0–15.0)
Lymphocytes Relative: 40 % (ref 12–46)
Lymphs Abs: 1.4 10*3/uL (ref 0.7–4.0)
MCH: 29.5 pg (ref 26.0–34.0)
MCHC: 32.3 g/dL (ref 30.0–36.0)
MCV: 91.6 fL (ref 78.0–100.0)
MONO ABS: 0.3 10*3/uL (ref 0.1–1.0)
MPV: 9.8 fL (ref 8.6–12.4)
Monocytes Relative: 8 % (ref 3–12)
Neutro Abs: 1.7 10*3/uL (ref 1.7–7.7)
Neutrophils Relative %: 50 % (ref 43–77)
Platelets: 265 10*3/uL (ref 150–400)
RBC: 4.4 MIL/uL (ref 3.87–5.11)
RDW: 13.9 % (ref 11.5–15.5)
WBC: 3.4 10*3/uL — AB (ref 4.0–10.5)

## 2014-07-07 LAB — LIPID PANEL
CHOL/HDL RATIO: 2.5 ratio
CHOLESTEROL: 160 mg/dL (ref 0–200)
HDL: 65 mg/dL (ref >=46)
LDL CALC: 83 mg/dL (ref 0–99)
Triglycerides: 59 mg/dL (ref <150)
VLDL: 12 mg/dL (ref 0–40)

## 2014-07-07 LAB — TSH: TSH: 1.189 u[IU]/mL (ref 0.350–4.500)

## 2014-07-07 LAB — HEMOGLOBIN A1C
Hgb A1c MFr Bld: 5.7 % — ABNORMAL HIGH (ref <5.7)
Mean Plasma Glucose: 117 mg/dL — ABNORMAL HIGH (ref <117)

## 2014-07-09 ENCOUNTER — Encounter: Payer: Self-pay | Admitting: Family Medicine

## 2014-07-09 ENCOUNTER — Ambulatory Visit (INDEPENDENT_AMBULATORY_CARE_PROVIDER_SITE_OTHER): Payer: BC Managed Care – PPO | Admitting: Family Medicine

## 2014-07-09 VITALS — BP 130/88 | HR 80 | Resp 16 | Ht 65.0 in | Wt 198.1 lb

## 2014-07-09 DIAGNOSIS — W57XXXA Bitten or stung by nonvenomous insect and other nonvenomous arthropods, initial encounter: Secondary | ICD-10-CM

## 2014-07-09 DIAGNOSIS — I10 Essential (primary) hypertension: Secondary | ICD-10-CM

## 2014-07-09 DIAGNOSIS — R7309 Other abnormal glucose: Secondary | ICD-10-CM

## 2014-07-09 DIAGNOSIS — E669 Obesity, unspecified: Secondary | ICD-10-CM

## 2014-07-09 DIAGNOSIS — L309 Dermatitis, unspecified: Secondary | ICD-10-CM | POA: Diagnosis not present

## 2014-07-09 DIAGNOSIS — D259 Leiomyoma of uterus, unspecified: Secondary | ICD-10-CM

## 2014-07-09 DIAGNOSIS — R7303 Prediabetes: Secondary | ICD-10-CM

## 2014-07-09 DIAGNOSIS — T148 Other injury of unspecified body region: Secondary | ICD-10-CM

## 2014-07-09 MED ORDER — PREDNISONE 5 MG PO TABS
5.0000 mg | ORAL_TABLET | Freq: Two times a day (BID) | ORAL | Status: AC
Start: 2014-07-09 — End: 2014-07-13

## 2014-07-09 MED ORDER — TRIAMTERENE-HCTZ 37.5-25 MG PO TABS: 1.0000 | ORAL_TABLET | Freq: Every day | ORAL | Status: DC

## 2014-07-09 NOTE — Patient Instructions (Signed)
F/U in 5 month, call if you need me before  Please take BP med every day at same time  Please work on good  health habits so that your health will improve. 1. Commitment to daily physical activity for 30 to 60  minutes, if you are able to do this.  2. Commitment to wise food choices. Aim for half of your  food intake to be vegetable and fruit, one quarter starchy foods, and one quarter protein. Try to eat on a regular schedule  3 meals per day, snacking between meals should be limited to vegetables or fruits or small portions of nuts. 64 ounces of water per day is generally recommended, unless you have specific health conditions, like heart failure or kidney failure where you will need to limit fluid intake.  3. Commitment to sufficient and a  good quality of physical and mental rest daily, generally between 6 to 8 hours per day.  WITH PERSISTANCE AND PERSEVERANCE, THE IMPOSSIBLE , BECOMES THE NORM!  Thanks for choosing [facility], we consider it a privelige to serve you. 5 days of prednisone sent for swelling around lips, and BP med refilled for 6 month  Mammogram due August

## 2014-07-25 DIAGNOSIS — W57XXXA Bitten or stung by nonvenomous insect and other nonvenomous arthropods, initial encounter: Secondary | ICD-10-CM | POA: Insufficient documentation

## 2014-07-25 NOTE — Assessment & Plan Note (Signed)
Controlled, no change in medication Sub optiml diastolic value DASH diet and commitment to daily physical activity for a minimum of 30 minutes discussed and encouraged, as a part of hypertension management. The importance of attaining a healthy weight is also discussed.  BP/Weight 07/09/2014 02/22/2014 11/27/2013 11/26/2013 11/21/2013 10/15/2013 03/17/8655  Systolic BP 846 962 952 - 841 324 401  Diastolic BP 88 90 72 - 89 84 100  Wt. (Lbs) 198.12 201 - 198 198.5 199 203  BMI 32.97 33.45 - 32.95 33.03 33.12 33.78

## 2014-07-25 NOTE — Progress Notes (Signed)
Alexa Savage     MRN: [id number]      DOB: February 17, 1970   HPI Alexa Savage is here for follow up and re-evaluation of chronic medical conditions, medication management and review of any available recent lab and radiology data.  Preventive health is updated, specifically  Cancer screening and Immunization.   Questions or concerns regarding consultations or procedures which the PT has had in the interim are  Addressed.Dissatisfied with recent dermatology eval The PT denies any adverse reactions to current medications since the last visit.  ROS Denies recent fever or chills. Denies sinus pressure, nasal congestion, ear pain or sore throat. Denies chest congestion, productive cough or wheezing. Denies chest pains, palpitations and leg swelling Denies abdominal pain, nausea, vomiting,diarrhea or constipation.   Denies dysuria, frequency, hesitancy or incontinence. Denies joint pain, swelling and limitation in mobility. Denies headaches, seizures, numbness, or tingling. Denies depression, anxiety or insomnia. C/o changes in pigmentation of skin and also recent tick bite. Is working on lifestyle change for improved health  PE  BP 130/88 mmHg  Pulse 80  Resp 16  Ht 5\' 5"  (1.651 m)  Wt 198 lb 1.9 oz (89.867 kg)  BMI 32.97 kg/m2  SpO2 100%  Patient alert and oriented and in no cardiopulmonary distress.  HEENT: No facial asymmetry, EOMI,   oropharynx pink and moist.  Neck supple no JVD, no mass.  Chest: Clear to auscultation bilaterally.  CVS: S1, S2 no murmurs, no S3.Regular rate.  ABD: Soft non tender.   Ext: No edema  MS: Adequate ROM spine, shoulders, hips and knees.  Skin: Intact,hypopigmented areas on face, also mild hyperpigmentation in area of recent tick bite , no infection Psych: Good eye contact, normal affect. Memory intact not anxious or depressed appearing.  CNS: CN 2-12 intact, power,  normal throughout.no focal deficits noted.   Assessment &  Plan   Dermatitis Hypopigmented facial rash, no response to treatment by derm to date, use of pure vaseline, cosmetics, and time recommended, if persists or worsens call for referral for 2nd opinion. No evidence of infection in area  Prediabetes  Unchanged. Patient educated about the importance of limiting  Carbohydrate intake , the need to commit to daily physical activity for a minimum of 30 minutes , and to commit weight loss. The fact that changes in all these areas will reduce or eliminate all together the development of diabetes is stressed.   Diabetic Labs Latest Ref Rng 07/06/2014 11/21/2013 10/15/2013 06/26/2013 08/29/2012  HbA1c <5.7 % 5.7(H) - 5.7(H) 5.7(H) 5.5  Chol 0 - 200 mg/dL 160 - - 179 149  HDL >=46 mg/dL 65 - - 66 51  Calc LDL 0 - 99 mg/dL 83 - - 99 82  Triglycerides <150 mg/dL 59 - - 71 79  Creatinine 0.50 - 1.10 mg/dL 0.91 0.83 0.84 0.85 0.89   BP/Weight 07/09/2014 02/22/2014 11/27/2013 11/26/2013 11/21/2013 10/15/2013 0/02/7492  Systolic BP 496 759 163 - 846 659 935  Diastolic BP 88 90 72 - 89 84 100  Wt. (Lbs) 198.12 201 - 198 198.5 199 203  BMI 32.97 33.45 - 32.95 33.03 33.12 33.78   No flowsheet data found.     HTN (hypertension) Controlled, no change in medication Sub optiml diastolic value DASH diet and commitment to daily physical activity for a minimum of 30 minutes discussed and encouraged, as a part of hypertension management. The importance of attaining a healthy weight is also discussed.  BP/Weight 07/09/2014 02/22/2014 11/27/2013 11/26/2013 11/21/2013 10/15/2013 06/26/2013  Systolic BP 979 480 165 - 537 482 707  Diastolic BP 88 90 72 - 89 84 100  Wt. (Lbs) 198.12 201 - 198 198.5 199 203  BMI 32.97 33.45 - 32.95 33.03 33.12 33.78        Obesity (BMI 30.0-34.9) Slight improvement. Patient re-educated about  the importance of commitment to a  minimum of 150 minutes of exercise per week.  The importance of healthy food choices with portion control  discussed. Encouraged to start a food diary, count calories and to consider  joining a support group. Sample diet sheets offered. Goals set by the patient for the next several months.   Weight /BMI 07/09/2014 02/22/2014 11/26/2013  WEIGHT 198 lb 1.9 oz 201 lb 198 lb  HEIGHT 5\' 5"  5\' 5"  5\' 5"   BMI 32.97 kg/m2 33.45 kg/m2 32.95 kg/m2    Current exercise per week 90 minutes.   Tick bite No retained tick, no redness or purulent drainage from area  Fibroid denies excessive menstrual pain or heavy periods following myomectomy, has hopes to conceive

## 2014-07-25 NOTE — Assessment & Plan Note (Signed)
denies excessive menstrual pain or heavy periods following myomectomy, has hopes to conceive

## 2014-07-25 NOTE — Assessment & Plan Note (Signed)
Slight improvement. Patient re-educated about  the importance of commitment to a  minimum of 150 minutes of exercise per week.  The importance of healthy food choices with portion control discussed. Encouraged to start a food diary, count calories and to consider  joining a support group. Sample diet sheets offered. Goals set by the patient for the next several months.   Weight /BMI 07/09/2014 02/22/2014 11/26/2013  WEIGHT 198 lb 1.9 oz 201 lb 198 lb  HEIGHT 5\' 5"  5\' 5"  5\' 5"   BMI 32.97 kg/m2 33.45 kg/m2 32.95 kg/m2    Current exercise per week 90 minutes.

## 2014-07-25 NOTE — Assessment & Plan Note (Signed)
No retained tick, no redness or purulent drainage from area

## 2014-07-25 NOTE — Assessment & Plan Note (Signed)
Hypopigmented facial rash, no response to treatment by derm to date, use of pure vaseline, cosmetics, and time recommended, if persists or worsens call for referral for 2nd opinion. No evidence of infection in area

## 2014-07-25 NOTE — Assessment & Plan Note (Signed)
  Unchanged. Patient educated about the importance of limiting  Carbohydrate intake , the need to commit to daily physical activity for a minimum of 30 minutes , and to commit weight loss. The fact that changes in all these areas will reduce or eliminate all together the development of diabetes is stressed.   Diabetic Labs Latest Ref Rng 07/06/2014 11/21/2013 10/15/2013 06/26/2013 08/29/2012  HbA1c <5.7 % 5.7(H) - 5.7(H) 5.7(H) 5.5  Chol 0 - 200 mg/dL 160 - - 179 149  HDL >=46 mg/dL 65 - - 66 51  Calc LDL 0 - 99 mg/dL 83 - - 99 82  Triglycerides <150 mg/dL 59 - - 71 79  Creatinine 0.50 - 1.10 mg/dL 0.91 0.83 0.84 0.85 0.89   BP/Weight 07/09/2014 02/22/2014 11/27/2013 11/26/2013 11/21/2013 10/15/2013 2/95/6213  Systolic BP 086 578 469 - 629 528 413  Diastolic BP 88 90 72 - 89 84 100  Wt. (Lbs) 198.12 201 - 198 198.5 199 203  BMI 32.97 33.45 - 32.95 33.03 33.12 33.78   No flowsheet data found.

## 2014-09-13 ENCOUNTER — Other Ambulatory Visit: Payer: Self-pay | Admitting: Family Medicine

## 2014-09-13 ENCOUNTER — Telehealth: Payer: Self-pay

## 2014-09-13 MED ORDER — PREDNISONE 5 MG (21) PO TBPK: 5.0000 mg | ORAL_TABLET | ORAL | Status: DC

## 2014-09-13 NOTE — Telephone Encounter (Signed)
Got in [place], has some on her arms and some on neck and on breast area. Wants something called in for it. Uses [facility] in [place]

## 2014-09-13 NOTE — Telephone Encounter (Signed)
Pt aware.

## 2014-09-13 NOTE — Telephone Encounter (Signed)
Sent let her know pls

## 2014-10-22 ENCOUNTER — Other Ambulatory Visit: Payer: Self-pay

## 2014-10-22 DIAGNOSIS — Z1231 Encounter for screening mammogram for malignant neoplasm of breast: Secondary | ICD-10-CM

## 2014-10-23 ENCOUNTER — Ambulatory Visit
Admission: RE | Admit: 2014-10-23 | Discharge: 2014-10-23 | Disposition: A | Discharge status: Home / Self Care | Payer: BC Managed Care – PPO | Source: Ambulatory Visit

## 2014-10-23 DIAGNOSIS — Z1231 Encounter for screening mammogram for malignant neoplasm of breast: Secondary | ICD-10-CM

## 2014-12-10 ENCOUNTER — Encounter: Payer: Self-pay | Admitting: Family Medicine

## 2014-12-10 ENCOUNTER — Ambulatory Visit (INDEPENDENT_AMBULATORY_CARE_PROVIDER_SITE_OTHER): Payer: BC Managed Care – PPO | Admitting: Family Medicine

## 2014-12-10 VITALS — BP 142/94 | HR 82 | Resp 16 | Ht 65.0 in | Wt 205.0 lb

## 2014-12-10 DIAGNOSIS — J302 Other seasonal allergic rhinitis: Secondary | ICD-10-CM

## 2014-12-10 DIAGNOSIS — E669 Obesity, unspecified: Secondary | ICD-10-CM

## 2014-12-10 DIAGNOSIS — Z1159 Encounter for screening for other viral diseases: Secondary | ICD-10-CM

## 2014-12-10 DIAGNOSIS — Z23 Encounter for immunization: Secondary | ICD-10-CM | POA: Diagnosis not present

## 2014-12-10 DIAGNOSIS — I10 Essential (primary) hypertension: Secondary | ICD-10-CM | POA: Diagnosis not present

## 2014-12-10 DIAGNOSIS — R7303 Prediabetes: Secondary | ICD-10-CM | POA: Diagnosis not present

## 2014-12-10 LAB — BASIC METABOLIC PANEL WITH GFR
BUN: 10 mg/dL (ref 7–25)
CO2: 23 mmol/L (ref 20–31)
CREATININE: 0.89 mg/dL (ref 0.50–1.10)
Calcium: 10.5 mg/dL — ABNORMAL HIGH (ref 8.6–10.2)
Chloride: 102 mmol/L (ref 98–110)
GFR, Est African American: >89 mL/min (ref >=60)
GFR, Est Non African American: 79 mL/min (ref >=60)
Glucose, Bld: 79 mg/dL (ref 65–99)
POTASSIUM: 4.8 mmol/L (ref 3.5–5.3)
Sodium: 140 mmol/L (ref 135–146)

## 2014-12-10 LAB — HIV ANTIBODY (ROUTINE TESTING W REFLEX): HIV 1&2 Ab, 4th Generation: NONREACTIVE

## 2014-12-10 NOTE — Patient Instructions (Signed)
Nurse BP check 2nd week in Jan, bring cuff please F/u with MD in 5.5 month, call if you need me sooner  HIV, HBA1C, chem 7 and EGFR today  BP is higher than it should be, changes in lifestyle with reward system as discussed please  Please work on good  health habits so that your health will improve. 1. Commitment to daily physical activity for 30 to 60  minutes, if you are able to do this.  2. Commitment to wise food choices. Aim for half of your  food intake to be vegetable and fruit, one quarter starchy foods, and one quarter protein. Try to eat on a regular schedule  3 meals per day, snacking between meals should be limited to vegetables or fruits or small portions of nuts. 64 ounces of water per day is generally recommended, unless you have specific health conditions, like heart failure or kidney failure where you will need to limit fluid intake.  3. Commitment to sufficient and a  good quality of physical and mental rest daily, generally between 6 to 8 hours per day.  WITH PERSISTANCE AND PERSEVERANCE, THE IMPOSSIBLE , BECOMES THE NORM!   Thanks for choosing [facility], we consider it a privelige to serve you.

## 2014-12-11 ENCOUNTER — Encounter: Payer: Self-pay | Admitting: Family Medicine

## 2014-12-11 LAB — HEMOGLOBIN A1C
Hgb A1c MFr Bld: 5.7 % — ABNORMAL HIGH (ref <5.7)
MEAN PLASMA GLUCOSE: 117 mg/dL — AB (ref <117)

## 2014-12-16 ENCOUNTER — Telehealth: Payer: Self-pay | Admitting: Family Medicine

## 2014-12-16 NOTE — Telephone Encounter (Signed)
Patient is calling stating that she is out of refill on her BP medication triamterene-hydrochlorothiazide (MAXZIDE-25) 37.5-25 MG per please advise?

## 2014-12-16 NOTE — Assessment & Plan Note (Signed)
No current flare intermittent , as  Needed use of medications at this time

## 2014-12-16 NOTE — Assessment & Plan Note (Signed)
Patient educated about the importance of limiting  Carbohydrate intake , the need to commit to daily physical activity for a minimum of 30 minutes , and to commit weight loss. The fact that changes in all these areas will reduce or eliminate all together the development of diabetes is stressed.  Unchanged Updated lab needed at/ before next visit.   Diabetic Labs Latest Ref Rng 12/10/2014 07/06/2014 11/21/2013 10/15/2013 06/26/2013  HbA1c <5.7 % 5.7(H) 5.7(H) - 5.7(H) 5.7(H)  Chol 0 - 200 mg/dL - 160 - - 179  HDL >=46 mg/dL - 65 - - 66  Calc LDL 0 - 99 mg/dL - 83 - - 99  Triglycerides <150 mg/dL - 59 - - 71  Creatinine 0.50 - 1.10 mg/dL 0.89 0.91 0.83 0.84 0.85   BP/Weight 12/10/2014 07/09/2014 02/22/2014 11/27/2013 11/26/2013 11/21/2013 7/90/2409  Systolic BP 735 329 924 268 - 341 962  Diastolic BP 94 88 90 72 - 89 84  Wt. (Lbs) 205 198.12 201 - 198 198.5 199  BMI 34.11 32.97 33.45 - 32.95 33.03 33.12   No flowsheet data found.

## 2014-12-16 NOTE — Assessment & Plan Note (Signed)
Deteriorated. Patient re-educated about  the importance of commitment to a  minimum of 150 minutes of exercise per week.  The importance of healthy food choices with portion control discussed. Encouraged to start a food diary, count calories and to consider  joining a support group. Sample diet sheets offered. Goals set by the patient for the next several months.   Weight /BMI 12/10/2014 07/09/2014 02/22/2014  WEIGHT 205 lb 198 lb 1.9 oz 201 lb  HEIGHT 5\' 5"  5\' 5"  5\' 5"   BMI 34.11 kg/m2 32.97 kg/m2 33.45 kg/m2    Current exercise per week 60 minutes.

## 2014-12-16 NOTE — Assessment & Plan Note (Signed)
Uncontrolled, reports normal values at home No med change  BP re check in 8 weeks DASH diet and commitment to daily physical activity for a minimum of 30 minutes discussed and encouraged, as a part of hypertension management. The importance of attaining a healthy weight is also discussed.  BP/Weight 12/10/2014 07/09/2014 02/22/2014 11/27/2013 11/26/2013 11/21/2013 8/41/3244  Systolic BP 010 272 536 644 - 034 742  Diastolic BP 94 88 90 72 - 89 84  Wt. (Lbs) 205 198.12 201 - 198 198.5 199  BMI 34.11 32.97 33.45 - 32.95 33.03 33.12

## 2014-12-16 NOTE — Progress Notes (Signed)
LATORSHA CURLING     MRN: [id number]      DOB: Jul 19, 1970   HPI Ms. Gugliotta is here for follow up and re-evaluation of chronic medical conditions, medication management and review of any available recent lab and radiology data.  Preventive health is updated, specifically  Cancer screening and Immunization.   Questions or concerns regarding consultations or procedures which the PT has had in the interim are  addressed. The PT denies any adverse reactions to current medications since the last visit.  There are no new concerns.  There are no specific complaints   ROS Denies recent fever or chills. Denies sinus pressure, nasal congestion, ear pain or sore throat. Denies chest congestion, productive cough or wheezing. Denies chest pains, palpitations and leg swelling Denies abdominal pain, nausea, vomiting,diarrhea or constipation.   Denies dysuria, frequency, hesitancy or incontinence. Denies joint pain, swelling and limitation in mobility. Denies headaches, seizures, numbness, or tingling. Denies depression, anxiety or insomnia. Denies skin break down or rash.   PE  BP 142/94 mmHg  Pulse 82  Resp 16  Ht 5\' 5"  (1.651 m)  Wt 205 lb (92.987 kg)  BMI 34.11 kg/m2  SpO2 98%  Patient alert and oriented and in no cardiopulmonary distress.  HEENT: No facial asymmetry, EOMI,   oropharynx pink and moist.  Neck supple no JVD, no mass.  Chest: Clear to auscultation bilaterally.  CVS: S1, S2 no murmurs, no S3.Regular rate.  ABD: Soft non tender.   Ext: No edema  MS: Adequate ROM spine, shoulders, hips and knees.  Skin: Intact, no ulcerations or rash noted.  Psych: Good eye contact, normal affect. Memory intact not anxious or depressed appearing.  CNS: CN 2-12 intact, power,  normal throughout.no focal deficits noted.   Assessment & Plan  HTN (hypertension) Uncontrolled, reports normal values at home No med change  BP re check in 8 weeks DASH diet and commitment to  daily physical activity for a minimum of 30 minutes discussed and encouraged, as a part of hypertension management. The importance of attaining a healthy weight is also discussed.  BP/Weight 12/10/2014 07/09/2014 02/22/2014 11/27/2013 11/26/2013 11/21/2013 0/02/7492  Systolic BP 496 759 163 846 - 659 935  Diastolic BP 94 88 90 72 - 89 84  Wt. (Lbs) 205 198.12 201 - 198 198.5 199  BMI 34.11 32.97 33.45 - 32.95 33.03 33.12        Obesity (BMI 30.0-34.9) Deteriorated. Patient re-educated about  the importance of commitment to a  minimum of 150 minutes of exercise per week.  The importance of healthy food choices with portion control discussed. Encouraged to start a food diary, count calories and to consider  joining a support group. Sample diet sheets offered. Goals set by the patient for the next several months.   Weight /BMI 12/10/2014 07/09/2014 02/22/2014  WEIGHT 205 lb 198 lb 1.9 oz 201 lb  HEIGHT 5\' 5"  5\' 5"  5\' 5"   BMI 34.11 kg/m2 32.97 kg/m2 33.45 kg/m2    Current exercise per week 60 minutes.   Prediabetes Patient educated about the importance of limiting  Carbohydrate intake , the need to commit to daily physical activity for a minimum of 30 minutes , and to commit weight loss. The fact that changes in all these areas will reduce or eliminate all together the development of diabetes is stressed.  Unchanged Updated lab needed at/ before next visit.   Diabetic Labs Latest Ref Rng 12/10/2014 07/06/2014 11/21/2013 10/15/2013 06/26/2013  HbA1c <5.7 %  5.7(H) 5.7(H) - 5.7(H) 5.7(H)  Chol 0 - 200 mg/dL - 160 - - 179  HDL >=46 mg/dL - 65 - - 66  Calc LDL 0 - 99 mg/dL - 83 - - 99  Triglycerides <150 mg/dL - 59 - - 71  Creatinine 0.50 - 1.10 mg/dL 0.89 0.91 0.83 0.84 0.85   BP/Weight 12/10/2014 07/09/2014 02/22/2014 11/27/2013 11/26/2013 11/21/2013 4/58/0998  Systolic BP 338 250 539 767 - 341 937  Diastolic BP 94 88 90 72 - 89 84  Wt. (Lbs) 205 198.12 201 - 198 198.5 199  BMI 34.11  32.97 33.45 - 32.95 33.03 33.12   No flowsheet data found.     Seasonal allergies No current flare intermittent , as  Needed use of medications at this time

## 2014-12-17 MED ORDER — TRIAMTERENE-HCTZ 37.5-25 MG PO TABS: 1.0000 | ORAL_TABLET | Freq: Every day | ORAL | Status: DC

## 2014-12-17 NOTE — Telephone Encounter (Signed)
Refill sent.

## 2015-02-25 ENCOUNTER — Ambulatory Visit: Payer: BC Managed Care – PPO

## 2015-03-03 ENCOUNTER — Ambulatory Visit: Payer: BC Managed Care – PPO

## 2015-03-03 VITALS — BP 130/90

## 2015-03-03 DIAGNOSIS — I10 Essential (primary) hypertension: Secondary | ICD-10-CM

## 2015-03-03 NOTE — Progress Notes (Signed)
Pressure was 130/90 and she checked it on her wrist cuff and it was 122/87. Patient states its good at home and at other Dr's so she isn't willing to change her med or add anything else on at this time. DASH diet given and patient will keep her next appt. On her way to another Dr to have labs done now.

## 2015-05-12 ENCOUNTER — Encounter: Payer: Self-pay | Admitting: Family Medicine

## 2015-05-12 ENCOUNTER — Ambulatory Visit (INDEPENDENT_AMBULATORY_CARE_PROVIDER_SITE_OTHER): Payer: BC Managed Care – PPO | Admitting: Family Medicine

## 2015-05-12 VITALS — BP 118/74 | HR 82 | Resp 18 | Ht 65.0 in | Wt 205.0 lb

## 2015-05-12 DIAGNOSIS — E669 Obesity, unspecified: Secondary | ICD-10-CM

## 2015-05-12 DIAGNOSIS — K648 Other hemorrhoids: Secondary | ICD-10-CM

## 2015-05-12 DIAGNOSIS — I10 Essential (primary) hypertension: Secondary | ICD-10-CM | POA: Diagnosis not present

## 2015-05-12 DIAGNOSIS — E559 Vitamin D deficiency, unspecified: Secondary | ICD-10-CM

## 2015-05-12 DIAGNOSIS — L309 Dermatitis, unspecified: Secondary | ICD-10-CM

## 2015-05-12 DIAGNOSIS — K644 Residual hemorrhoidal skin tags: Secondary | ICD-10-CM

## 2015-05-12 DIAGNOSIS — Z114 Encounter for screening for human immunodeficiency virus [HIV]: Secondary | ICD-10-CM

## 2015-05-12 DIAGNOSIS — R7303 Prediabetes: Secondary | ICD-10-CM | POA: Diagnosis not present

## 2015-05-12 MED ORDER — HYDROCORTISONE ACE-PRAMOXINE 1-1 % RE FOAM: 1.0000 | Freq: Two times a day (BID) | RECTAL | Status: DC

## 2015-05-12 NOTE — Patient Instructions (Signed)
F/u early June, or after , call if you need me before  Fasting labs May 21 or shortly after  Please work on good  health habits so that your health will improve. 1. Commitment to daily physical activity for 30 to 60  minutes, if you are able to do this.  2. Commitment to wise food choices. Aim for half of your  food intake to be vegetable and fruit, one quarter starchy foods, and one quarter protein. Try to eat on a regular schedule  3 meals per day, snacking between meals should be limited to vegetables or fruits or small portions of nuts. 64 ounces of water per day is generally recommended, unless you have specific health conditions, like heart failure or kidney failure where you will need to limit fluid intake.  3. Commitment to sufficient and a  good quality of physical and mental rest daily, generally between 6 to 8 hours per day.  WITH PERSISTANCE AND PERSEVERANCE, THE IMPOSSIBLE , BECOMES THE NORM!  Medication is being sent for hemmorhoids  Thanks for choosing [facility], we consider it a privelige to serve you.

## 2015-05-12 NOTE — Progress Notes (Signed)
Subjective:    Patient ID: Alexa Savage, female    DOB: Feb 22, 1970, 45 y.o.   MRN: [id number]  HPI   ORLINE BLACKLEY     MRN: [id number]      DOB: 03-04-1970   HPI Alexa Savage is here for follow up and re-evaluation of chronic medical conditions, medication management and review of any available recent lab and radiology data.  Preventive health is updated, specifically  Cancer screening and Immunization.   Questions or concerns regarding consultations or procedures which the PT has had in the interim are  addressed. The PT denies any adverse reactions to current medications since the last visit.  There are no new concerns.  There are no specific complaints   ROS Denies recent fever or chills. Denies sinus pressure, nasal congestion, ear pain or sore throat. Denies chest congestion, productive cough or wheezing. Denies chest pains, palpitations and leg swelling Denies abdominal pain, nausea, vomiting,diarrhea or constipation.   Denies dysuria, frequency, hesitancy or incontinence. Denies joint pain, swelling and limitation in mobility. Denies headaches, seizures, numbness, or tingling. Denies depression, anxiety or insomnia. Recurrnt lip swelling PEer  BP 118/74 mmHg  Pulse 82  Resp 18  Ht 5\' 5"  (1.651 m)  Wt 205 lb (92.987 kg)  BMI 34.11 kg/m2  SpO2 98%  Patient alert and oriented and in no cardiopulmonary distress.  HEENT: No facacial assymetry or sinus tenderness. Mild erythema and swelling of upper l lip Chest: Clear to auscultation bilaterally.  CVS: S1, S2 no murmurs, no S3.Regular rate.  ABD: Soft non tender.   Ext: No edema  MS: Adequate ROM spine, shoulders, hips and knees.  Skin: Intact, no ulcerations or rash noted.  Psych: Good eye contact, normal affect. Memory intact not anxious or depressed appearing.  CNS: CN 2-12 intact, power,  normal throughout.no focal deficits noted.   Assessment & Plan   HTN (hypertension) Controlled, no  change in medication DASH diet and commitment to daily physical activity for a minimum of 30 minutes discussed and encouraged, as a part of hypertension management. The importance of attaining a healthy weight is also discussed.  BP/Weight 05/12/2015 03/03/2015 12/10/2014 07/09/2014 02/22/2014 11/27/2013 [id number]  Systolic BP [id number] -  Diastolic BP 74 90 94 88 90 72 -  Wt. (Lbs) 205 - 205 198.12 201 - 198  BMI 34.11 - 34.11 32.97 33.45 - 32.95        External hemorrhoid Current flare, weducated re need to have BM regularly and to use stool softener daily to reduce straining and irritating the condition  Dermatitis Recurrent lip and perioral swelling, has questions as to whether may be her BP medication, though I doubt this , offered to change the med, then stated she wanted to "watch condition more closely" Good news is that no other allergic symptoms related to lip swelling. Advised no product on lips other than pure vaseline, and note the effects of certain foods on lips Also recommended allergy testing , wants to hold off now  Obesity (BMI 30.0-34.9) Unchnaged. Patient re-educated about  the importance of commitment to a  minimum of 150 minutes of exercise per week.  The importance of healthy food choices with portion control discussed. Encouraged to start a food diary, count calories and to consider  joining a support group. Sample diet sheets offered. Goals set by the patient for the next several months.   Weight /BMI 05/12/2015 12/10/2014 07/09/2014  WEIGHT 205 lb 205 lb 198  lb 1.9 oz  HEIGHT 5\' 5"  5\' 5"  5\' 5"   BMI 34.11 kg/m2 34.11 kg/m2 32.97 kg/m2    Current exercise per week 90 minutes.   Prediabetes Patient educated about the importance of limiting  Carbohydrate intake , the need to commit to daily physical activity for a minimum of 30 minutes , and to commit weight loss. The fact that changes in all these areas will reduce or eliminate all together  the development of diabetes is stressed.  Updated lab needed at/ before next visit.   Diabetic Labs Latest Ref Rng 12/10/2014 07/06/2014 11/21/2013 10/15/2013 06/26/2013  HbA1c <5.7 % 5.7(H) 5.7(H) - 5.7(H) 5.7(H)  Chol 0 - 200 mg/dL - 160 - - 179  HDL >=46 mg/dL - 65 - - 66  Calc LDL 0 - 99 mg/dL - 83 - - 99  Triglycerides <150 mg/dL - 59 - - 71  Creatinine 0.50 - 1.10 mg/dL 0.89 0.91 0.83 0.84 0.85   BP/Weight 05/12/2015 03/03/2015 12/10/2014 07/09/2014 02/22/2014 11/27/2013 [id number]  Systolic BP [id number] -  Diastolic BP 74 90 94 88 90 72 -  Wt. (Lbs) 205 - 205 198.12 201 - 198  BMI 34.11 - 34.11 32.97 33.45 - 32.95   No flowsheet data found.          Review of Systems     Objective:   Physical Exam        Assessment & Plan:

## 2015-05-14 NOTE — Assessment & Plan Note (Signed)
Controlled, no change in medication DASH diet and commitment to daily physical activity for a minimum of 30 minutes discussed and encouraged, as a part of hypertension management. The importance of attaining a healthy weight is also discussed.  BP/Weight 05/12/2015 03/03/2015 12/10/2014 07/09/2014 02/22/2014 11/27/2013 123XX123  Systolic BP 123456 [id number] -  Diastolic BP 74 90 94 88 90 72 -  Wt. (Lbs) 205 - 205 198.12 201 - 198  BMI 34.11 - 34.11 32.97 33.45 - 32.95

## 2015-05-14 NOTE — Assessment & Plan Note (Signed)
Unchnaged. Patient re-educated about  the importance of commitment to a  minimum of 150 minutes of exercise per week.  The importance of healthy food choices with portion control discussed. Encouraged to start a food diary, count calories and to consider  joining a support group. Sample diet sheets offered. Goals set by the patient for the next several months.   Weight /BMI 05/12/2015 12/10/2014 07/09/2014  WEIGHT 205 lb 205 lb 198 lb 1.9 oz  HEIGHT 5\' 5"  5\' 5"  5\' 5"   BMI 34.11 kg/m2 34.11 kg/m2 32.97 kg/m2    Current exercise per week 90 minutes.

## 2015-05-14 NOTE — Assessment & Plan Note (Signed)
Current flare, weducated re need to have BM regularly and to use stool softener daily to reduce straining and irritating the condition

## 2015-05-14 NOTE — Assessment & Plan Note (Signed)
Recurrent lip and perioral swelling, has questions as to whether may be her BP medication, though I doubt this , offered to change the med, then stated she wanted to "watch condition more closely" Good news is that no other allergic symptoms related to lip swelling. Advised no product on lips other than pure vaseline, and note the effects of certain foods on lips Also recommended allergy testing , wants to hold off now

## 2015-05-14 NOTE — Assessment & Plan Note (Signed)
Patient educated about the importance of limiting  Carbohydrate intake , the need to commit to daily physical activity for a minimum of 30 minutes , and to commit weight loss. The fact that changes in all these areas will reduce or eliminate all together the development of diabetes is stressed.  Updated lab needed at/ before next visit.   Diabetic Labs Latest Ref Rng 12/10/2014 07/06/2014 11/21/2013 10/15/2013 06/26/2013  HbA1c <5.7 % 5.7(H) 5.7(H) - 5.7(H) 5.7(H)  Chol 0 - 200 mg/dL - 160 - - 179  HDL >=46 mg/dL - 65 - - 66  Calc LDL 0 - 99 mg/dL - 83 - - 99  Triglycerides <150 mg/dL - 59 - - 71  Creatinine 0.50 - 1.10 mg/dL 0.89 0.91 0.83 0.84 0.85   BP/Weight 05/12/2015 03/03/2015 12/10/2014 07/09/2014 02/22/2014 11/27/2013 [id number]  Systolic BP 123456 [id number] -  Diastolic BP 74 90 94 88 90 72 -  Wt. (Lbs) 205 - 205 198.12 201 - 198  BMI 34.11 - 34.11 32.97 33.45 - 32.95   No flowsheet data found.

## 2015-05-15 ENCOUNTER — Telehealth: Payer: Self-pay

## 2015-05-15 ENCOUNTER — Other Ambulatory Visit: Payer: Self-pay | Admitting: Family Medicine

## 2015-05-15 ENCOUNTER — Ambulatory Visit (INDEPENDENT_AMBULATORY_CARE_PROVIDER_SITE_OTHER): Payer: BC Managed Care – PPO

## 2015-05-15 DIAGNOSIS — R220 Localized swelling, mass and lump, head: Secondary | ICD-10-CM

## 2015-05-15 MED ORDER — PREDNISONE 5 MG PO TABS: 5.0000 mg | ORAL_TABLET | Freq: Two times a day (BID) | ORAL | Status: DC

## 2015-05-15 MED ORDER — METHYLPREDNISOLONE ACETATE 80 MG/ML IJ SUSP
80.0000 mg | Freq: Once | INTRAMUSCULAR | Status: AC
  Administered 2015-05-15: 80 mg via INTRAMUSCULAR

## 2015-05-15 NOTE — Telephone Encounter (Signed)
Verbal order given for Depo Medrol 80mg  IM and Prednisone 5mg  BID x 10 days.  Medication sent to requested pharmacy and injection given in office.

## 2015-05-15 NOTE — Progress Notes (Signed)
Patient c/o left sided facial numbness and lip swelling x 24 hours.   Verbal orders received and carried out for Depo Medrol 80mg  inj and Prednisone 5mg  BID #10.   Patient would also like to be referred to allergist.   Patient will call if symptoms worsen or go to the ED.

## 2015-05-15 NOTE — Telephone Encounter (Signed)
Referral entered  

## 2015-05-29 ENCOUNTER — Telehealth: Payer: Self-pay | Admitting: Family Medicine

## 2015-05-29 MED ORDER — PREDNISONE 5 MG (21) PO TBPK: 5.0000 mg | ORAL_TABLET | ORAL | Status: DC

## 2015-05-29 NOTE — Telephone Encounter (Signed)
Called and left message for patient on both home and cell #s.

## 2015-05-29 NOTE — Telephone Encounter (Signed)
Patient aware.

## 2015-05-29 NOTE — Telephone Encounter (Signed)
Patient is calling stating that her lips and face are swollen again and she is asking if Dr. Moshe Cipro could call her in some Prednisone until she can see the Allergist. I had to re refer her to Dr. Neldon Mc due to Dr. Annamaria Boots not taking any new Allergy Patients and I have put Urgent on the new referral to Dr. Neldon Mc, please advise?

## 2015-05-29 NOTE — Telephone Encounter (Signed)
Med sent pls let her know 

## 2015-05-29 NOTE — Telephone Encounter (Signed)
Please advise 

## 2015-05-29 NOTE — Telephone Encounter (Signed)
Opened in Error.

## 2015-06-18 ENCOUNTER — Ambulatory Visit (INDEPENDENT_AMBULATORY_CARE_PROVIDER_SITE_OTHER): Payer: BC Managed Care – PPO | Admitting: Allergy and Immunology

## 2015-06-18 ENCOUNTER — Encounter: Payer: Self-pay | Admitting: Allergy and Immunology

## 2015-06-18 VITALS — BP 110/72 | HR 80 | Temp 98.2°F | Resp 18 | Ht 64.57 in | Wt 206.1 lb

## 2015-06-18 DIAGNOSIS — L500 Allergic urticaria: Secondary | ICD-10-CM

## 2015-06-18 DIAGNOSIS — T783XXA Angioneurotic edema, initial encounter: Secondary | ICD-10-CM

## 2015-06-18 DIAGNOSIS — H1045 Other chronic allergic conjunctivitis: Secondary | ICD-10-CM | POA: Diagnosis not present

## 2015-06-18 DIAGNOSIS — J3089 Other allergic rhinitis: Secondary | ICD-10-CM

## 2015-06-18 DIAGNOSIS — H1010 Acute atopic conjunctivitis, unspecified eye: Secondary | ICD-10-CM | POA: Insufficient documentation

## 2015-06-18 MED ORDER — LEVOCETIRIZINE DIHYDROCHLORIDE 5 MG PO TABS: 5.0000 mg | ORAL_TABLET | Freq: Every day | ORAL | Status: DC | PRN

## 2015-06-18 MED ORDER — OLOPATADINE HCL 0.7 % OP SOLN: 1.0000 [drp] | OPHTHALMIC | Status: DC

## 2015-06-18 NOTE — Assessment & Plan Note (Signed)
Unclear etiology.  Food allergen skin testing was negative today despite a positive histamine control.  The patient is not taking an ACE inhibitor. NSAIDs may exacerbate angioedema but in this case are not the underlying etiology as demonstrated by the fact that the patient has experienced angioedema in the absence of NSAIDs. There are no concomitant symptoms concerning for anaphylaxis or constitutional symptoms worrisome for an underlying malignancy. The patient may have developed urticaria on 1 occasion. Will order labs to rule out hereditary angioedema, acquired angioedema, urticaria associated angioedema, and other potential etiologies. For symptom relief, patient is to take oral antihistamines as directed. However, if the underlying pathophysiology is bradykinin mediated, antihistamines will not reduce symptoms.  The following labs have been ordered: TSH, antithyroglobulin antibody, thyroid peroxidase antibody, tryptase, C4, C1 esterase inhibitor (quantitative and functional), C1q, factor XII, FCeRI antibody, CBC, CMP, and galactose-alpha-1,3-galactose IgE level.  The patient will be notified with further recommendations after lab results have returned.  Instructions have been provided and discussed for H1/H2 receptor blockade with step-wise increase/decrease to find lowest effective dose.  Should symptoms recur, a  journal is to be kept recording any foods eaten, beverages consumed, medications taken, activities performed, and environmental conditions within a 6 hour period prior to the onset of symptoms. For any symptoms concerning for anaphylaxis, 911 is to be called immediately.

## 2015-06-18 NOTE — Patient Instructions (Addendum)
Angioedema Unclear etiology.  Food allergen skin testing was negative today despite a positive histamine control.  The patient is not taking an ACE inhibitor. NSAIDs may exacerbate angioedema but in this case are not the underlying etiology as demonstrated by the fact that the patient has experienced angioedema in the absence of NSAIDs. There are no concomitant symptoms concerning for anaphylaxis or constitutional symptoms worrisome for an underlying malignancy. The patient may have developed urticaria on 1 occasion. Will order labs to rule out hereditary angioedema, acquired angioedema, urticaria associated angioedema, and other potential etiologies. For symptom relief, patient is to take oral antihistamines as directed. However, if the underlying pathophysiology is bradykinin mediated, antihistamines will not reduce symptoms.  The following labs have been ordered: TSH, antithyroglobulin antibody, thyroid peroxidase antibody, tryptase, C4, C1 esterase inhibitor (quantitative and functional), C1q, factor XII, FCeRI antibody, CBC, CMP, and galactose-alpha-1,3-galactose IgE level.  The patient will be notified with further recommendations after lab results have returned.  Instructions have been provided and discussed for H1/H2 receptor blockade with step-wise increase/decrease to find lowest effective dose.  Should symptoms recur, a  journal is to be kept recording any foods eaten, beverages consumed, medications taken, activities performed, and environmental conditions within a 6 hour period prior to the onset of symptoms. For any symptoms concerning for anaphylaxis, 911 is to be called immediately.  Perennial and seasonal allergic rhinitis  Aeroallergen avoidance measures have been discussed and provided in written form.  A prescription has been provided for levocetirizine, 5 mg daily as needed.  Continue Nasonex, one spray per nostril twice daily as needed.  I have also recommended nasal saline  spray (i.e. Simply Saline) as needed prior to medicated nasal sprays.  Seasonal allergic conjunctivitis  Treatment plan as outlined above for allergic rhinitis.  A prescription has been provided for Pazeo, one drop per eye daily as needed.    Return When lab results have returned the patient will be called with further recommendations and follow up.  Urticaria (Hives)  . Levocetirizine (Xyzal) 5 mg in morning and Cetirizine (Zyrtec) 10mg  at night and ranitidine (Zantac) 150 mg twice a day. If no symptoms for 7-14 days then decrease to. . Levocetirizine (Xyzal) 5 mg in morning and Cetirizine (Zyrtec) 10mg  at night and ranitidine (Zantac) 150 mg once a day.  If no symptoms for 7-14 days then decrease to. . Levocetirizine (Xyzal) 5 mg in morning and Cetirizine (Zyrtec) 10mg  at night.  If no symptoms for 7-14 days then decrease to. . Levocetirizine (Xyzal) 5 mg once a day.  May use Benadryl (diphenhydramine) as needed for breakthrough symptoms       If symptoms return, then step up dosage  Reducing Pollen Exposure  The American Academy of Allergy, Asthma and Immunology suggests the following steps to reduce your exposure to pollen during allergy seasons.    1. Do not hang sheets or clothing out to dry; pollen may collect on these items. 2. Do not mow lawns or spend time around freshly cut grass; mowing stirs up pollen. 3. Keep windows closed at night.  Keep car windows closed while driving. 4. Minimize morning activities outdoors, a time when pollen counts are usually at their highest. 5. Stay indoors as much as possible when pollen counts or humidity is high and on windy days when pollen tends to remain in the air longer. 6. Use air conditioning when possible.  Many air conditioners have filters that trap the pollen spores. 7. Use a HEPA room air filter  to remove pollen form the indoor air you breathe.   Control of Mold Allergen  Mold and fungi can grow on a variety of surfaces  provided certain temperature and moisture conditions exist.  Outdoor molds grow on plants, decaying vegetation and soil.  The major outdoor mold, Alternaria and Cladosporium, are found in very high numbers during hot and dry conditions.  Generally, a late Summer - Fall peak is seen for common outdoor fungal spores.  Rain will temporarily lower outdoor mold spore count, but counts rise rapidly when the rainy period ends.  The most important indoor molds are Aspergillus and Penicillium.  Dark, humid and poorly ventilated basements are ideal sites for mold growth.  The next most common sites of mold growth are the bathroom and the kitchen.  Outdoor [employer] 1. Use air conditioning and keep windows closed 2. Avoid exposure to decaying vegetation. 3. Avoid leaf raking. 4. Avoid grain handling. 5. Consider wearing a face mask if working in moldy areas.  Indoor Mold Control 1. Maintain humidity below 50%. 2. Clean washable surfaces with 5% bleach solution. 3. Remove sources e.g. Contaminated carpets.  Control of House Dust Mite Allergen  House dust mites play a major role in allergic asthma and rhinitis.  They occur in environments with high humidity wherever human skin, the food for dust mites is found. High levels have been detected in dust obtained from mattresses, pillows, carpets, upholstered furniture, bed covers, clothes and soft toys.  The principal allergen of the house dust mite is found in its feces.  A gram of dust may contain 1,000 mites and 250,000 fecal particles.  Mite antigen is easily measured in the air during house cleaning activities.    1. Encase mattresses, including the box spring, and pillow, in an air tight cover.  Seal the zipper end of the encased mattresses with wide adhesive tape. 2. Wash the bedding in water of 130 degrees Farenheit weekly.  Avoid cotton comforters/quilts and flannel bedding: the most ideal bed covering is the dacron comforter. 3. Remove all  upholstered furniture from the bedroom. 4. Remove carpets, carpet padding, rugs, and non-washable window drapes from the bedroom.  Wash drapes weekly or use plastic window coverings. 5. Remove all non-washable stuffed toys from the bedroom.  Wash stuffed toys weekly. 6. Have the room cleaned frequently with a vacuum cleaner and a damp dust-mop.  The patient should not be in a room which is being cleaned and should wait 1 hour after cleaning before going into the room. 7. Close and seal all heating outlets in the bedroom.  Otherwise, the room will become filled with dust-laden air.  An electric heater can be used to heat the room. 8. Reduce indoor humidity to less than 50%.  Do not use a humidifier.

## 2015-06-18 NOTE — Assessment & Plan Note (Signed)
   Aeroallergen avoidance measures have been discussed and provided in written form.  A prescription has been provided for levocetirizine, 5 mg daily as needed.  Continue Nasonex, one spray per nostril twice daily as needed.  I have also recommended nasal saline spray (i.e. Simply Saline) as needed prior to medicated nasal sprays.

## 2015-06-18 NOTE — Progress Notes (Addendum)
New Patient Note  RE: Alexa Savage MRN: [id number] DOB: [birth year] Date of Office Visit: 06/18/2015  Referring provider: Fayrene Helper, MD Primary care provider: Tula Nakayama, MD  Chief Complaint: Angioedema; Rash; and Allergic Rhinitis    History of present illness: HPI Comments: Alexa Savage is a 45 y.o. female presenting today for consultation of angioedema and rhinitis.  In August 2016 Alexa Savage developed bilateral eyelid swelling.  She went to the local urgent care and was treated with prednisone.  Her symptoms resolved over the next 2 or 3 days.  She has not experienced concomitant cardiopulmonary or GI symptoms.  She has experienced an additional 2 episodes of angioedema over the past 2 or 3 months.  Angioedema has involved the left side of her face, eyelids, and lips.  During one of the episodes she believes that she may have had hives on the left side of her neck.  She did not experience concomitant cardiopulmonary or GI symptoms during your these episodes.  She experiences prodromal tingling approximately 12 hours prior to the onset of angioedema.  She is not and has not been on an ACE inhibitor. Alexa Savage experiences nasal congestion, rhinorrhea, and sneezing.  These symptoms occur year around but are more frequent and severe in the springtime.   Assessment and plan: Angioedema Unclear etiology.  Food allergen skin testing was negative today despite a positive histamine control.  The patient is not taking an ACE inhibitor. NSAIDs may exacerbate angioedema but in this case are not the underlying etiology as demonstrated by the fact that the patient has experienced angioedema in the absence of NSAIDs. There are no concomitant symptoms concerning for anaphylaxis or constitutional symptoms worrisome for an underlying malignancy. The patient may have developed urticaria on 1 occasion. Will order labs to rule out hereditary angioedema, acquired angioedema, urticaria associated  angioedema, and other potential etiologies. For symptom relief, patient is to take oral antihistamines as directed. However, if the underlying pathophysiology is bradykinin mediated, antihistamines will not reduce symptoms.  The following labs have been ordered: TSH, antithyroglobulin antibody, thyroid peroxidase antibody, tryptase, C4, C1 esterase inhibitor (quantitative and functional), C1q, factor XII, FCeRI antibody, CBC, CMP, and galactose-alpha-1,3-galactose IgE level.  The patient will be notified with further recommendations after lab results have returned.  Instructions have been provided and discussed for H1/H2 receptor blockade with step-wise increase/decrease to find lowest effective dose.  Should symptoms recur, a  journal is to be kept recording any foods eaten, beverages consumed, medications taken, activities performed, and environmental conditions within a 6 hour period prior to the onset of symptoms. For any symptoms concerning for anaphylaxis, 911 is to be called immediately.  Perennial and seasonal allergic rhinitis  Aeroallergen avoidance measures have been discussed and provided in written form.  A prescription has been provided for levocetirizine, 5 mg daily as needed.  Continue Nasonex, one spray per nostril twice daily as needed.  I have also recommended nasal saline spray (i.e. Simply Saline) as needed prior to medicated nasal sprays.  Seasonal allergic conjunctivitis  Treatment plan as outlined above for allergic rhinitis.  A prescription has been provided for Pazeo, one drop per eye daily as needed.   Diagnositics: Environmental skin testing: Positive to grass pollen, ragweed pollen, tree pollen, mold, and dust mite antigen. Food allergen skin testing: Negative despite a positive histamine control.    Physical examination: Blood pressure 110/72, pulse 80, temperature 98.2 F (36.8 C), temperature source Oral, resp. rate 18, height 5' 4.57" (1.64  m),  weight 206 lb 2.1 oz (93.5 kg).  General: Alert, interactive, in no acute distress. HEENT: TMs pearly gray, turbinates edematous without discharge, post-pharynx moderately erythematous. Neck: Supple without lymphadenopathy. Lungs: Clear to auscultation without wheezing, rhonchi or rales. CV: Normal S1, S2 without murmurs. Abdomen: Nondistended, nontender. Skin: Warm and dry, without lesions or rashes. Extremities:  No clubbing, cyanosis or edema. Neuro:   Grossly intact.  Review of systems:  Review of Systems  Constitutional: Negative for fever, chills and weight loss.  HENT: Positive for congestion. Negative for nosebleeds.   Eyes: Negative for blurred vision.  Respiratory: Negative for hemoptysis, shortness of breath and wheezing.   Cardiovascular: Negative for chest pain.  Gastrointestinal: Negative for nausea, vomiting, abdominal pain, diarrhea and constipation.  Genitourinary: Negative for dysuria.  Musculoskeletal: Negative for myalgias and joint pain.  Skin: Positive for rash.  Neurological: Negative for dizziness and loss of consciousness.  Endo/Heme/Allergies: Positive for environmental allergies. Does not bruise/bleed easily.    Past medical history:  Past Medical History  Diagnosis Date  . Allergy   . Hypertension   . H/O hiatal hernia   . Headache     migraines  . Breast cyst     right  . Anemia   . Diabetes mellitus without complication ([place])     borderline    Past surgical history:  Past Surgical History  Procedure Laterality Date  . Wisdom tooth extraction    . Myomectomy N/A 11/26/2013    Procedure: Exploratory Laparotomy MYOMECTOMY;  Surgeon: Marvene Staff, MD;  Location: [facility];  Service: Gynecology;  Laterality: N/A;    Family history: Family History  Problem Relation Age of Onset  . Hypertension Mother   . Heart disease Father   . Cancer Maternal Aunt     Breast Cancer  . Hypertension Maternal Aunt   . Diabetes Maternal Grandmother    . Asthma Neg Hx     Social history: Social History   Social History  . Marital Status: Single    Spouse Name: N/A  . Number of Children: N/A  . Years of Education: N/A   Occupational History  . Not on file.   Social History Main Topics  . Smoking status: Never Smoker   . Smokeless tobacco: Never Used  . Alcohol Use: No     Comment: rare  . Drug Use: No  . Sexual Activity: Not on file   Other Topics Concern  . Not on file   Social History Narrative   Environmental History: The patient lives in a 47 year old house with carpeting throughout and central air/heat.  She is a nonsmoker without pets.    Medication List       This list is accurate as of: 06/18/15  4:48 PM.  Always use your most recent med list.               BENADRYL EX  Apply topically as needed. Benadryl Cream.     BENADRYL PO  Take by mouth as needed.     cetirizine 10 MG tablet  Commonly known as:  ZYRTEC  Take 10 mg by mouth daily as needed for allergies.     ferrous sulfate 325 (65 FE) MG tablet  Take 325 mg by mouth daily with breakfast.     hydrocortisone-pramoxine rectal foam  Commonly known as:  PROCTOFOAM HC  Place 1 applicator rectally 2 (two) times daily.     ibuprofen 800 MG tablet  Commonly known as:  ADVIL,MOTRIN  Take 1 tablet (800 mg total) by mouth every 8 (eight) hours as needed (mild pain).     levocetirizine 5 MG tablet  Commonly known as:  XYZAL  Take 1 tablet (5 mg total) by mouth daily as needed for allergies.     loratadine 10 MG tablet  Commonly known as:  CLARITIN  Take 10 mg by mouth daily as needed for allergies.     medroxyPROGESTERone 5 MG tablet  Commonly known as:  PROVERA     mometasone 50 MCG/ACT nasal spray  Commonly known as:  NASONEX  Place 2 sprays into the nose daily.     multivitamin with minerals tablet  Take 1 tablet by mouth daily.     NUTRITIONAL SUPPLEMENT PO  Take 1 tablet by mouth. Keratin     Olopatadine HCl 0.7 % Soln    Commonly known as:  PAZEO  Place 1 drop into both eyes 1 day or 1 dose.     triamterene-hydrochlorothiazide 37.5-25 MG tablet  Commonly known as:  MAXZIDE-25  Take 1 tablet by mouth daily.     vitamin A 8000 UNIT capsule  Take 8,000 Units by mouth daily.        Known medication allergies: Allergies  Allergen Reactions  . Penicillins Hives    I appreciate the opportunity to take part in this Alexa Savage's care. Please do not hesitate to contact me with questions.  Sincerely,   R. Edgar Frisk, MD

## 2015-06-18 NOTE — Assessment & Plan Note (Signed)
   Treatment plan as outlined above for allergic rhinitis.  A prescription has been provided for Pazeo, one drop per eye daily as needed. 

## 2015-06-21 LAB — CBC WITH DIFFERENTIAL/PLATELET
Basophils Absolute: 0 cells/uL (ref 0–200)
Basophils Relative: 0 %
Eosinophils Absolute: 34 cells/uL (ref 15–500)
Eosinophils Relative: 1 %
HCT: 37.6 % (ref 35.0–45.0)
Hemoglobin: 12.3 g/dL (ref 11.7–15.5)
Lymphocytes Relative: 39 %
Lymphs Abs: 1326 cells/uL (ref 850–3900)
MCH: 29 pg (ref 27.0–33.0)
MCHC: 32.7 g/dL (ref 32.0–36.0)
MCV: 88.7 fL (ref 80.0–100.0)
MPV: 9.7 fL (ref 7.5–12.5)
Monocytes Absolute: 272 cells/uL (ref 200–950)
Monocytes Relative: 8 %
Neutro Abs: 1768 cells/uL (ref 1500–7800)
Neutrophils Relative %: 52 %
Platelets: 270 10*3/uL (ref 140–400)
RBC: 4.24 MIL/uL (ref 3.80–5.10)
RDW: 14.2 % (ref 11.0–15.0)
WBC: 3.4 10*3/uL — ABNORMAL LOW (ref 3.8–10.8)

## 2015-06-21 LAB — COMPREHENSIVE METABOLIC PANEL
ALT: 10 U/L (ref 6–29)
AST: 12 U/L (ref 10–35)
Albumin: 4.3 g/dL (ref 3.6–5.1)
Alkaline Phosphatase: 58 U/L (ref 33–115)
BUN: 15 mg/dL (ref 7–25)
CO2: 25 mmol/L (ref 20–31)
Calcium: 9.1 mg/dL (ref 8.6–10.2)
Chloride: 105 mmol/L (ref 98–110)
Creat: 0.87 mg/dL (ref 0.50–1.10)
Glucose, Bld: 73 mg/dL (ref 65–99)
Potassium: 4 mmol/L (ref 3.5–5.3)
Sodium: 139 mmol/L (ref 135–146)
Total Bilirubin: 0.5 mg/dL (ref 0.2–1.2)
Total Protein: 7.1 g/dL (ref 6.1–8.1)

## 2015-06-23 LAB — C4 COMPLEMENT: C4 Complement: 36 mg/dL (ref 10–40)

## 2015-06-24 LAB — TRYPTASE: Tryptase: 2.6 ug/L (ref <11)

## 2015-06-25 LAB — ALPHA-GAL PANEL
Allergen, Mutton, f88: <0.1 kU/L
Allergen, Pork, f26: <0.1 kU/L
Beef: <0.1 kU/L
Galactose-alpha-1,3-galactose IgE: <0.1 kU/L (ref <0.35)

## 2015-06-27 LAB — COMPLEMENT COMPONENT C1Q: Complement C1Q: 8 mg/dL (ref 5.0–8.6)

## 2015-06-28 LAB — FACTOR 12 ASSAY: Factor XII Activity: 80 % (ref 50–150)

## 2015-06-30 ENCOUNTER — Ambulatory Visit: Payer: Self-pay | Admitting: Allergy and Immunology

## 2015-06-30 LAB — CP CHRONIC URTICARIA INDEX PANEL
Histamine Release: <16 % (ref <16)
TSH: 1.33 mIU/L
Thyroglobulin Ab: <1 IU/mL (ref <2)
Thyroperoxidase Ab SerPl-aCnc: 115 IU/mL — ABNORMAL HIGH (ref <9)

## 2015-06-30 LAB — C1 ESTERASE INHIBITOR, FUNCTIONAL: C1INH Functional/C1INH Total MFr SerPl: 90 % (ref >=68)

## 2015-08-04 LAB — CBC
HEMATOCRIT: 37.9 % (ref 35.0–45.0)
HEMOGLOBIN: 12.6 g/dL (ref 11.7–15.5)
MCH: 28.8 pg (ref 27.0–33.0)
MCHC: 33.2 g/dL (ref 32.0–36.0)
MCV: 86.5 fL (ref 80.0–100.0)
MPV: 10 fL (ref 7.5–12.5)
Platelets: 243 10*3/uL (ref 140–400)
RBC: 4.38 MIL/uL (ref 3.80–5.10)
RDW: 14.1 % (ref 11.0–15.0)
WBC: 3.5 10*3/uL — ABNORMAL LOW (ref 3.8–10.8)

## 2015-08-04 LAB — HEMOGLOBIN A1C
Hgb A1c MFr Bld: 5.7 % — ABNORMAL HIGH (ref <5.7)
Mean Plasma Glucose: 117 mg/dL

## 2015-08-05 LAB — LIPID PANEL
Cholesterol: 190 mg/dL (ref 125–200)
HDL: 85 mg/dL (ref >=46)
LDL CALC: 90 mg/dL (ref <130)
TRIGLYCERIDES: 76 mg/dL (ref <150)
Total CHOL/HDL Ratio: 2.2 Ratio (ref <=5.0)
VLDL: 15 mg/dL (ref <30)

## 2015-08-05 LAB — HIV ANTIBODY (ROUTINE TESTING W REFLEX): HIV 1&2 Ab, 4th Generation: NONREACTIVE

## 2015-08-05 LAB — COMPREHENSIVE METABOLIC PANEL
ALBUMIN: 3.9 g/dL (ref 3.6–5.1)
ALK PHOS: 61 U/L (ref 33–115)
ALT: 11 U/L (ref 6–29)
AST: 14 U/L (ref 10–35)
BILIRUBIN TOTAL: 0.4 mg/dL (ref 0.2–1.2)
BUN: 11 mg/dL (ref 7–25)
CALCIUM: 9.2 mg/dL (ref 8.6–10.2)
CO2: 29 mmol/L (ref 20–31)
Chloride: 105 mmol/L (ref 98–110)
Creat: 0.81 mg/dL (ref 0.50–1.10)
Glucose, Bld: 87 mg/dL (ref 65–99)
POTASSIUM: 4.6 mmol/L (ref 3.5–5.3)
Sodium: 142 mmol/L (ref 135–146)
Total Protein: 6.9 g/dL (ref 6.1–8.1)

## 2015-08-05 LAB — VITAMIN D 25 HYDROXY (VIT D DEFICIENCY, FRACTURES): VIT D 25 HYDROXY: 12 ng/mL — AB (ref 30–100)

## 2015-08-05 LAB — TSH: TSH: 1.52 m[IU]/L

## 2015-08-06 ENCOUNTER — Ambulatory Visit (INDEPENDENT_AMBULATORY_CARE_PROVIDER_SITE_OTHER): Payer: BC Managed Care – PPO | Admitting: Family Medicine

## 2015-08-06 ENCOUNTER — Encounter: Payer: Self-pay | Admitting: Family Medicine

## 2015-08-06 VITALS — BP 140/90 | HR 85 | Resp 16 | Ht 65.0 in | Wt 207.0 lb

## 2015-08-06 DIAGNOSIS — E669 Obesity, unspecified: Secondary | ICD-10-CM | POA: Diagnosis not present

## 2015-08-06 DIAGNOSIS — W57XXXA Bitten or stung by nonvenomous insect and other nonvenomous arthropods, initial encounter: Secondary | ICD-10-CM

## 2015-08-06 DIAGNOSIS — L500 Allergic urticaria: Secondary | ICD-10-CM

## 2015-08-06 DIAGNOSIS — E559 Vitamin D deficiency, unspecified: Secondary | ICD-10-CM | POA: Diagnosis not present

## 2015-08-06 DIAGNOSIS — I10 Essential (primary) hypertension: Secondary | ICD-10-CM | POA: Diagnosis not present

## 2015-08-06 DIAGNOSIS — R7303 Prediabetes: Secondary | ICD-10-CM

## 2015-08-06 DIAGNOSIS — T148 Other injury of unspecified body region: Secondary | ICD-10-CM

## 2015-08-06 DIAGNOSIS — E66811 Obesity, class 1: Secondary | ICD-10-CM

## 2015-08-06 NOTE — Patient Instructions (Addendum)
F/u in 5.5 month, call if you need me before  Pls resume blood pressure medication  No retained tick seen  Start Vit D3 800 IU once daily  Continue daily iron  Vitamin D and chem 7 and cBc in 5.5 months  PLS STOP SODAS  Please work on good  health habits so that your health will improve. 1. Commitment to daily physical activity for 30 to 60  minutes, if you are able to do this.  2. Commitment to wise food choices. Aim for half of your  food intake to be vegetable and fruit, one quarter starchy foods, and one quarter protein. Try to eat on a regular schedule  3 meals per day, snacking between meals should be limited to vegetables or fruits or small portions of nuts. 64 ounces of water per day is generally recommended, unless you have specific health conditions, like heart failure or kidney failure where you will need to limit fluid intake.  3. Commitment to sufficient and a  good quality of physical and mental rest daily, generally between 6 to 8 hours per day.  WITH PERSISTANCE AND PERSEVERANCE, THE IMPOSSIBLE , BECOMES THE NORM!   Thank you  for choosing [facility]. We consider it a privelige to serve you.  Delivering excellent health care in a caring and  compassionate way is our goal.  Partnering with you,  so that together we can achieve this goal is our strategy.

## 2015-08-09 DIAGNOSIS — W57XXXA Bitten or stung by nonvenomous insect and other nonvenomous arthropods, initial encounter: Secondary | ICD-10-CM | POA: Insufficient documentation

## 2015-08-09 NOTE — Assessment & Plan Note (Signed)
Deteriorated. Patient re-educated about  the importance of commitment to a  minimum of 150 minutes of exercise per week.  The importance of healthy food choices with portion control discussed. Encouraged to start a food diary, count calories and to consider  joining a support group. Sample diet sheets offered. Goals set by the patient for the next several months.   Weight /BMI 08/06/2015 06/18/2015 05/12/2015  WEIGHT 207 lb 206 lb 2.1 oz 205 lb  HEIGHT 5\' 5"  5' 4.567" 5\' 5"   BMI 34.45 kg/m2 34.76 kg/m2 34.11 kg/m2    Current exercise per week 60 minutes.

## 2015-08-09 NOTE — Assessment & Plan Note (Signed)
Patient educated about the importance of limiting  Carbohydrate intake , the need to commit to daily physical activity for a minimum of 30 minutes , and to commit weight loss. The fact that changes in all these areas will reduce or eliminate all together the development of diabetes is stressed.   Diabetic Labs Latest Ref Rng 08/04/2015 06/21/2015 12/10/2014 07/06/2014 11/21/2013  HbA1c <5.7 % 5.7(H) - 5.7(H) 5.7(H) -  Chol 125 - 200 mg/dL 190 - - 160 -  HDL >=46 mg/dL 85 - - 65 -  Calc LDL <130 mg/dL 90 - - 83 -  Triglycerides <150 mg/dL 76 - - 59 -  Creatinine 0.50 - 1.10 mg/dL 0.81 0.87 0.89 0.91 0.83   BP/Weight 08/06/2015 06/18/2015 05/12/2015 03/03/2015 12/10/2014 [id number]  Systolic BP XX123456 A999333 123456 [id number] A999333 [id number]  Diastolic BP 90 72 74 90 94 88 90  Wt. (Lbs) 207 206.13 205 - 205 198.12 201  BMI 34.45 34.76 34.11 - 34.11 32.97 33.45   No flowsheet data found.

## 2015-08-09 NOTE — Assessment & Plan Note (Signed)
Managed by allergist, compliance with recommended treatment is not at goal, no recurrences to date of angioedema

## 2015-08-09 NOTE — Assessment & Plan Note (Signed)
Uncontrolled, has not taken med for several days, needs to resume, pt in denial of her disease and its implications I believe DASH diet and commitment to daily physical activity for a minimum of 30 minutes discussed and encouraged, as a part of hypertension management. The importance of attaining a healthy weight is also discussed.  BP/Weight 08/06/2015 06/18/2015 05/12/2015 03/03/2015 12/10/2014 [id number]  Systolic BP XX123456 A999333 123456 [id number] A999333 [id number]  Diastolic BP 90 72 74 90 94 88 90  Wt. (Lbs) 207 206.13 205 - 205 198.12 201  BMI 34.45 34.76 34.11 - 34.11 32.97 33.45

## 2015-08-09 NOTE — Progress Notes (Signed)
Alexa Savage     MRN: [id number]      DOB: 09/25/1970   HPI Alexa Savage is here for follow up and re-evaluation of chronic medical conditions, medication management and review of any available recent lab and radiology data.  Preventive health is updated, specifically  Cancer screening and Immunization.   Questions or concerns regarding consultations or procedures which the PT has had in the interim are  addressed. The PT denies any adverse reactions to current medications since the last visit.  There are no new concerns.  There are no specific complaints   ROS Denies recent fever or chills. Denies sinus pressure, nasal congestion, ear pain or sore throat. Denies chest congestion, productive cough or wheezing. Denies chest pains, palpitations and leg swelling Denies abdominal pain, nausea, vomiting,diarrhea or constipation.   Denies dysuria, frequency, hesitancy or incontinence. Denies joint pain, swelling and limitation in mobility. Denies headaches, seizures, numbness, or tingling. Denies depression, anxiety or insomnia. Removed 2 ticks, no pain, or drainage, no muscle aches or fever, wants areas checked PE  BP 140/90 mmHg  Pulse 85  Resp 16  Ht 5\' 5"  (1.651 m)  Wt 207 lb (93.895 kg)  BMI 34.45 kg/m2  SpO2 100%  Patient alert and oriented and in no cardiopulmonary distress.  HEENT: No facial asymmetry, EOMI,   oropharynx pink and moist.  Neck supple no JVD, no mass.  Chest: Clear to auscultation bilaterally.  CVS: S1, S2 no murmurs, no S3.Regular rate.  ABD: Soft non tender.   Ext: No edema  MS: Adequate ROM spine, shoulders, hips and knees.  Skin: lesion in anterior chest and back where 2 ticks recently removed, no residual tick part seen or palpated, no warmth or drainage.  Psych: Good eye contact, normal affect. Memory intact not anxious or depressed appearing.  CNS: CN 2-12 intact, power,  normal throughout.no focal deficits noted.   Assessment &  Plan  HTN (hypertension) Uncontrolled, has not taken med for several days, needs to resume, pt in denial of her disease and its implications I believe DASH diet and commitment to daily physical activity for a minimum of 30 minutes discussed and encouraged, as a part of hypertension management. The importance of attaining a healthy weight is also discussed.  BP/Weight 08/06/2015 06/18/2015 05/12/2015 03/03/2015 12/10/2014 [id number]  Systolic BP XX123456 A999333 123456 [id number] A999333 [id number]  Diastolic BP 90 72 74 90 94 88 90  Wt. (Lbs) 207 206.13 205 - 205 198.12 201  BMI 34.45 34.76 34.11 - 34.11 32.97 33.45        Prediabetes Patient educated about the importance of limiting  Carbohydrate intake , the need to commit to daily physical activity for a minimum of 30 minutes , and to commit weight loss. The fact that changes in all these areas will reduce or eliminate all together the development of diabetes is stressed.   Diabetic Labs Latest Ref Rng 08/04/2015 06/21/2015 12/10/2014 07/06/2014 11/21/2013  HbA1c <5.7 % 5.7(H) - 5.7(H) 5.7(H) -  Chol 125 - 200 mg/dL 190 - - 160 -  HDL >=46 mg/dL 85 - - 65 -  Calc LDL <130 mg/dL 90 - - 83 -  Triglycerides <150 mg/dL 76 - - 59 -  Creatinine 0.50 - 1.10 mg/dL 0.81 0.87 0.89 0.91 0.83   BP/Weight 08/06/2015 06/18/2015 05/12/2015 03/03/2015 12/10/2014 [id number]  Systolic BP XX123456 A999333 123456 [id number] A999333 [id number]  Diastolic BP 90 72 74 90 94 88 90  Wt. (Lbs)  207 206.13 205 - 205 198.12 201  BMI 34.45 34.76 34.11 - 34.11 32.97 33.45   No flowsheet data found.     Obesity (BMI 30.0-34.9) Deteriorated. Patient re-educated about  the importance of commitment to a  minimum of 150 minutes of exercise per week.  The importance of healthy food choices with portion control discussed. Encouraged to start a food diary, count calories and to consider  joining a support group. Sample diet sheets offered. Goals set by the patient for the next several months.   Weight  /BMI 08/06/2015 06/18/2015 05/12/2015  WEIGHT 207 lb 206 lb 2.1 oz 205 lb  HEIGHT 5\' 5"  5' 4.567" 5\' 5"   BMI 34.45 kg/m2 34.76 kg/m2 34.11 kg/m2    Current exercise per week 60 minutes.   Allergic urticaria Managed by allergist, compliance with recommended treatment is not at goal, no recurrences to date of angioedema  Tick bites 2 recent tick bites, no signs of retained tick,or complication from bite . Skin protection and care reviewed

## 2015-08-09 NOTE — Assessment & Plan Note (Signed)
2 recent tick bites, no signs of retained tick,or complication from bite . Skin protection and care reviewed

## 2015-11-26 ENCOUNTER — Other Ambulatory Visit: Payer: Self-pay | Admitting: Family Medicine

## 2015-11-26 DIAGNOSIS — Z1231 Encounter for screening mammogram for malignant neoplasm of breast: Secondary | ICD-10-CM

## 2015-12-16 ENCOUNTER — Ambulatory Visit
Admission: RE | Admit: 2015-12-16 | Discharge: 2015-12-16 | Disposition: A | Discharge status: Home / Self Care | Payer: BC Managed Care – PPO | Source: Ambulatory Visit | Attending: Family Medicine | Admitting: Family Medicine

## 2015-12-16 DIAGNOSIS — Z1231 Encounter for screening mammogram for malignant neoplasm of breast: Secondary | ICD-10-CM

## 2016-01-26 ENCOUNTER — Ambulatory Visit: Payer: BC Managed Care – PPO | Admitting: Family Medicine

## 2016-03-10 ENCOUNTER — Ambulatory Visit (INDEPENDENT_AMBULATORY_CARE_PROVIDER_SITE_OTHER): Payer: BC Managed Care – PPO | Admitting: Family Medicine

## 2016-03-10 ENCOUNTER — Encounter: Payer: Self-pay | Admitting: Family Medicine

## 2016-03-10 VITALS — BP 128/84 | HR 108 | Resp 16 | Ht 65.0 in | Wt 199.1 lb

## 2016-03-10 DIAGNOSIS — I10 Essential (primary) hypertension: Secondary | ICD-10-CM | POA: Diagnosis not present

## 2016-03-10 DIAGNOSIS — L500 Allergic urticaria: Secondary | ICD-10-CM

## 2016-03-10 DIAGNOSIS — R7303 Prediabetes: Secondary | ICD-10-CM

## 2016-03-10 DIAGNOSIS — E669 Obesity, unspecified: Secondary | ICD-10-CM | POA: Diagnosis not present

## 2016-03-10 DIAGNOSIS — D259 Leiomyoma of uterus, unspecified: Secondary | ICD-10-CM

## 2016-03-10 MED ORDER — TRIAMTERENE-HCTZ 37.5-25 MG PO TABS: 1.0000 | ORAL_TABLET | Freq: Every day | ORAL | 2 refills | Status: DC

## 2016-03-10 NOTE — Assessment & Plan Note (Signed)
Controlled, no change in medication DASH diet and commitment to daily physical activity for a minimum of 30 minutes discussed and encouraged, as a part of hypertension management. The importance of attaining a healthy weight is also discussed.  BP/Weight 03/10/2016 08/06/2015 06/18/2015 05/12/2015 03/03/2015 12/10/2014 [id number]  Systolic BP 0000000 [id number] A999333 123456 [id number] [id number]  Diastolic BP 84 90 72 74 90 94 88  Wt. (Lbs) 199.12 207 206.13 205 - 205 198.12  BMI 33.14 34.45 34.76 34.11 - 34.11 32.97

## 2016-03-10 NOTE — Assessment & Plan Note (Signed)
Followed by gyne, minimal symptoms , appt this week

## 2016-03-10 NOTE — Assessment & Plan Note (Signed)
Good response to zyrtec as needed

## 2016-03-10 NOTE — Progress Notes (Signed)
Alexa Savage     MRN: [id number]      DOB: 29-Dec-1970   HPI Alexa Savage is here for follow up and re-evaluation of chronic medical conditions, medication management and review of any available recent lab and radiology data.  Preventive health is updated, specifically  Cancer screening and Immunization.   Questions or concerns regarding consultations or procedures which the PT has had in the interim are  addressed. The PT denies any adverse reactions to current medications since the last visit.  There are no new concerns.  There are no specific complaints  Has been successful with weight loss and improved blood pressure , will continue to work on this, she is encouraged No significant recurrent lip swelling , uses zyrtec as needed. Has gyne appt this week, cycle becoming irreular, bleeding is less heavy  ROS Denies recent fever or chills. Denies sinus pressure, nasal congestion, ear pain or sore throat. Denies chest congestion, productive cough or wheezing. Denies chest pains, palpitations and leg swelling Denies abdominal pain, nausea, vomiting,diarrhea or constipation.   Denies dysuria, frequency, hesitancy or incontinence. Denies joint pain, swelling and limitation in mobility. Denies headaches, seizures, numbness, or tingling. Denies depression, anxiety or insomnia. Denies skin break down or rash.   PE  BP 128/84   Pulse (!) 108   Resp 16   Ht 5\' 5"  (1.651 m)   Wt 199 lb 1.9 oz (90.3 kg)   SpO2 97%   BMI 33.14 kg/m   Patient alert and oriented and in no cardiopulmonary distress.  HEENT: No facial asymmetry, EOMI,   oropharynx pink and moist.  Neck supple no JVD, no mass.  Chest: Clear to auscultation bilaterally.  CVS: S1, S2 no murmurs, no S3.Regular rate.  ABD: Soft non tender.   Ext: No edema  MS: Adequate ROM spine, shoulders, hips and knees.  Skin: Intact, no ulcerations or rash noted.  Psych: Good eye contact, normal affect. Memory intact not  anxious or depressed appearing.  CNS: CN 2-12 intact, power,  normal throughout.no focal deficits noted.   Assessment & Plan  HTN (hypertension) Controlled, no change in medication DASH diet and commitment to daily physical activity for a minimum of 30 minutes discussed and encouraged, as a part of hypertension management. The importance of attaining a healthy weight is also discussed.  BP/Weight 03/10/2016 08/06/2015 06/18/2015 05/12/2015 03/03/2015 12/10/2014 [id number]  Systolic BP 0000000 [id number] A999333 123456 [id number] A999333 [id number]  Diastolic BP 84 90 72 74 90 94 88  Wt. (Lbs) 199.12 207 206.13 205 - 205 198.12  BMI 33.14 34.45 34.76 34.11 - 34.11 32.97       Obesity (BMI 30.0-34.9) Improved Patient re-educated about  the importance of commitment to a  minimum of 150 minutes of exercise per week.  The importance of healthy food choices with portion control discussed. Encouraged to start a food diary, count calories and to consider  joining a support group. Sample diet sheets offered. Goals set by the patient for the next several months.   Weight /BMI 03/10/2016 08/06/2015 06/18/2015  WEIGHT 199 lb 1.9 oz 207 lb 206 lb 2.1 oz  HEIGHT 5\' 5"  5\' 5"  5' 4.567"  BMI 33.14 kg/m2 34.45 kg/m2 34.76 kg/m2      Prediabetes Patient educated about the importance of limiting  Carbohydrate intake , the need to commit to daily physical activity for a minimum of 30 minutes , and to commit weight loss. The fact that changes in all these areas will reduce  or eliminate all together the development of diabetes is stressed.   Diabetic Labs Latest Ref Rng & Units 08/04/2015 06/21/2015 12/10/2014 07/06/2014 11/21/2013  HbA1c <5.7 % 5.7(H) - 5.7(H) 5.7(H) -  Chol 125 - 200 mg/dL 190 - - 160 -  HDL >=46 mg/dL 85 - - 65 -  Calc LDL <130 mg/dL 90 - - 83 -  Triglycerides <150 mg/dL 76 - - 59 -  Creatinine 0.50 - 1.10 mg/dL 0.81 0.87 0.89 0.91 0.83   BP/Weight 03/10/2016 08/06/2015 06/18/2015 05/12/2015 03/03/2015 12/10/2014 [id number]    Systolic BP 0000000 [id number] A999333 123456 [id number] A999333 [id number]  Diastolic BP 84 90 72 74 90 94 88  Wt. (Lbs) 199.12 207 206.13 205 - 205 198.12  BMI 33.14 34.45 34.76 34.11 - 34.11 32.97   No flowsheet data found.  Updated lab needed at/ before next visit.   Fibroid Followed by gyne, minimal symptoms , appt this week  Allergic urticaria Good response to zyrtec as needed

## 2016-03-10 NOTE — Assessment & Plan Note (Signed)
Improved Patient re-educated about  the importance of commitment to a  minimum of 150 minutes of exercise per week.  The importance of healthy food choices with portion control discussed. Encouraged to start a food diary, count calories and to consider  joining a support group. Sample diet sheets offered. Goals set by the patient for the next several months.   Weight /BMI 03/10/2016 08/06/2015 06/18/2015  WEIGHT 199 lb 1.9 oz 207 lb 206 lb 2.1 oz  HEIGHT 5\' 5"  5\' 5"  5' 4.567"  BMI 33.14 kg/m2 34.45 kg/m2 34.76 kg/m2

## 2016-03-10 NOTE — Assessment & Plan Note (Signed)
Patient educated about the importance of limiting  Carbohydrate intake , the need to commit to daily physical activity for a minimum of 30 minutes , and to commit weight loss. The fact that changes in all these areas will reduce or eliminate all together the development of diabetes is stressed.   Diabetic Labs Latest Ref Rng & Units 08/04/2015 06/21/2015 12/10/2014 07/06/2014 11/21/2013  HbA1c <5.7 % 5.7(H) - 5.7(H) 5.7(H) -  Chol 125 - 200 mg/dL 190 - - 160 -  HDL >=46 mg/dL 85 - - 65 -  Calc LDL <130 mg/dL 90 - - 83 -  Triglycerides <150 mg/dL 76 - - 59 -  Creatinine 0.50 - 1.10 mg/dL 0.81 0.87 0.89 0.91 0.83   BP/Weight 03/10/2016 08/06/2015 06/18/2015 05/12/2015 03/03/2015 12/10/2014 [id number]  Systolic BP 0000000 [id number] A999333 123456 [id number] A999333 [id number]  Diastolic BP 84 90 72 74 90 94 88  Wt. (Lbs) 199.12 207 206.13 205 - 205 198.12  BMI 33.14 34.45 34.76 34.11 - 34.11 32.97   No flowsheet data found.  Updated lab needed at/ before next visit.

## 2016-03-10 NOTE — Patient Instructions (Addendum)
F/U in 6 month, call if you need me  Sooner  CONGRATULATION, I know you are inspired to continue the progress  No med change   Fastng CBC, lipid, cmp, HBa1C, TSH, and Vit D end June or early July  It is important that you exercise regularly at least 30 minutes 5 times a week. If you develop chest pain, have severe difficulty breathing, or feel very tired, stop exercising immediately and seek medical attention    Name of book to help with green eating will be given at checkout

## 2016-06-17 ENCOUNTER — Telehealth: Payer: Self-pay

## 2016-06-17 ENCOUNTER — Other Ambulatory Visit: Payer: Self-pay | Admitting: Family Medicine

## 2016-06-17 MED ORDER — PREDNISONE 5 MG PO TABS: 5.0000 mg | ORAL_TABLET | Freq: Two times a day (BID) | ORAL | 0 refills | Status: AC

## 2016-06-17 MED ORDER — LEVOCETIRIZINE DIHYDROCHLORIDE 5 MG PO TABS: 5.0000 mg | ORAL_TABLET | Freq: Every evening | ORAL | 4 refills | Status: DC

## 2016-06-17 NOTE — Telephone Encounter (Signed)
Offer next week  appointment pease, if she needs it she is to call in early. Three day course of prednisone, a daily  xyzal have been prescribed, I tried to sppk with her no answer

## 2016-06-17 NOTE — Telephone Encounter (Signed)
Patient states her face is broken out again like it has in the past. Rash with puffiness. Would like something sent or an appt if she has to be seen first

## 2016-06-18 NOTE — Telephone Encounter (Signed)
Message left on cell voice mail.

## 2016-06-22 ENCOUNTER — Telehealth: Payer: Self-pay | Admitting: Family Medicine

## 2016-06-22 NOTE — Telephone Encounter (Signed)
r/s list for 08/24/16-  Called to r/s appt, no answer, left vm

## 2016-08-24 ENCOUNTER — Ambulatory Visit: Payer: BC Managed Care – PPO | Admitting: Family Medicine

## 2016-09-17 ENCOUNTER — Other Ambulatory Visit: Payer: Self-pay | Admitting: Obstetrics and Gynecology

## 2016-09-21 ENCOUNTER — Ambulatory Visit: Payer: BC Managed Care – PPO | Admitting: Family Medicine

## 2016-09-21 NOTE — Patient Instructions (Signed)
Your procedure is scheduled on:  Tomorrow, Aug. 9, 2018  Enter through the [employer] [facility] at:  10:00 AM  Pick up the phone at the desk and dial [phone].  Call this number if you have problems the morning of surgery: [phone].  Remember: Do NOT eat food or drink after:  Midnight Tonight   Take these medicines the morning of surgery with a SIP OF WATER:  Triamterene  Stop ALL herbal medications at this time  Do NOT smoke the day of surgery.  Do NOT wear jewelry (body piercing), metal hair clips/bobby pins, make-up, artifical eyelashes or nail polish. Do NOT wear lotions, powders, or perfumes.  You may wear deodorant. Do NOT shave for 48 hours prior to surgery. Do NOT bring valuables to the hospital. Contacts, dentures, or bridgework may not be worn into surgery.  Have a responsible adult drive you home and stay with you for 24 hours after your procedure  Bring a copy of your healthcare power of attorney and living will documents.

## 2016-09-22 ENCOUNTER — Encounter (HOSPITAL_COMMUNITY): Payer: Self-pay

## 2016-09-22 ENCOUNTER — Encounter (HOSPITAL_COMMUNITY)
Admission: RE | Admit: 2016-09-22 | Discharge: 2016-09-22 | Disposition: A | Discharge status: Home / Self Care | Payer: BC Managed Care – PPO | Source: Ambulatory Visit | Attending: Obstetrics and Gynecology | Admitting: Obstetrics and Gynecology

## 2016-09-22 DIAGNOSIS — Z6833 Body mass index (BMI) 33.0-33.9, adult: Secondary | ICD-10-CM | POA: Diagnosis not present

## 2016-09-22 DIAGNOSIS — N858 Other specified noninflammatory disorders of uterus: Secondary | ICD-10-CM | POA: Diagnosis present

## 2016-09-22 DIAGNOSIS — I10 Essential (primary) hypertension: Secondary | ICD-10-CM | POA: Diagnosis not present

## 2016-09-22 DIAGNOSIS — E669 Obesity, unspecified: Secondary | ICD-10-CM | POA: Diagnosis not present

## 2016-09-22 DIAGNOSIS — N840 Polyp of corpus uteri: Secondary | ICD-10-CM | POA: Diagnosis not present

## 2016-09-22 HISTORY — DX: Chronic sinusitis, unspecified: J32.9

## 2016-09-22 HISTORY — DX: Localized edema: R60.0

## 2016-09-22 LAB — CBC
HEMATOCRIT: 37.4 % (ref 36.0–46.0)
Hemoglobin: 12.2 g/dL (ref 12.0–15.0)
MCH: 29 pg (ref 26.0–34.0)
MCHC: 32.6 g/dL (ref 30.0–36.0)
MCV: 88.8 fL (ref 78.0–100.0)
PLATELETS: 282 10*3/uL (ref 150–400)
RBC: 4.21 MIL/uL (ref 3.87–5.11)
RDW: 14.2 % (ref 11.5–15.5)
WBC: 5.9 10*3/uL (ref 4.0–10.5)

## 2016-09-22 LAB — BASIC METABOLIC PANEL
Anion gap: 8 (ref 5–15)
BUN: 13 mg/dL (ref 6–20)
CHLORIDE: 106 mmol/L (ref 101–111)
CO2: 27 mmol/L (ref 22–32)
CREATININE: 1.03 mg/dL — AB (ref 0.44–1.00)
Calcium: 10.2 mg/dL (ref 8.9–10.3)
GFR calc Af Amer: >60 mL/min (ref >60)
GLUCOSE: 97 mg/dL (ref 65–99)
POTASSIUM: 4.3 mmol/L (ref 3.5–5.1)
SODIUM: 141 mmol/L (ref 135–145)

## 2016-09-23 ENCOUNTER — Ambulatory Visit (HOSPITAL_COMMUNITY): Payer: BC Managed Care – PPO | Admitting: Anesthesiology

## 2016-09-23 ENCOUNTER — Encounter (HOSPITAL_COMMUNITY): Payer: Self-pay

## 2016-09-23 ENCOUNTER — Ambulatory Visit (HOSPITAL_COMMUNITY)
Admission: RE | Admit: 2016-09-23 | Discharge: 2016-09-23 | Disposition: A | Discharge status: Home / Self Care | Payer: BC Managed Care – PPO | Source: Ambulatory Visit | Attending: Obstetrics and Gynecology | Admitting: Obstetrics and Gynecology

## 2016-09-23 ENCOUNTER — Encounter (HOSPITAL_COMMUNITY)
Admission: RE | Disposition: A | Discharge status: Home / Self Care | Payer: Self-pay | Source: Ambulatory Visit | Attending: Obstetrics and Gynecology

## 2016-09-23 DIAGNOSIS — Z6833 Body mass index (BMI) 33.0-33.9, adult: Secondary | ICD-10-CM | POA: Insufficient documentation

## 2016-09-23 DIAGNOSIS — N840 Polyp of corpus uteri: Secondary | ICD-10-CM | POA: Insufficient documentation

## 2016-09-23 DIAGNOSIS — I10 Essential (primary) hypertension: Secondary | ICD-10-CM | POA: Insufficient documentation

## 2016-09-23 DIAGNOSIS — E669 Obesity, unspecified: Secondary | ICD-10-CM | POA: Insufficient documentation

## 2016-09-23 HISTORY — PX: DILATATION & CURETTAGE/HYSTEROSCOPY WITH MYOSURE: SHX6511

## 2016-09-23 LAB — GLUCOSE, CAPILLARY: Glucose-Capillary: 83 mg/dL (ref 65–99)

## 2016-09-23 SURGERY — DILATATION & CURETTAGE/HYSTEROSCOPY WITH MYOSURE
Anesthesia: General | Site: Vagina

## 2016-09-23 MED ORDER — KETOROLAC TROMETHAMINE 30 MG/ML IJ SOLN
INTRAMUSCULAR | Status: AC
  Filled 2016-09-23: qty 1

## 2016-09-23 MED ORDER — FENTANYL CITRATE (PF) 100 MCG/2ML IJ SOLN
INTRAMUSCULAR | Status: DC | PRN
  Administered 2016-09-23 (×2): 50 ug via INTRAVENOUS

## 2016-09-23 MED ORDER — SODIUM CHLORIDE 0.9 % IR SOLN
Status: DC | PRN
  Administered 2016-09-23 (×2): 3000 mL

## 2016-09-23 MED ORDER — SCOPOLAMINE 1 MG/3DAYS TD PT72
MEDICATED_PATCH | TRANSDERMAL | Status: DC
Start: 2016-09-23 — End: 2016-09-23
  Administered 2016-09-23: 1.5 mg via TRANSDERMAL
  Filled 2016-09-23: qty 1

## 2016-09-23 MED ORDER — FENTANYL CITRATE (PF) 100 MCG/2ML IJ SOLN: 25.0000 ug | INTRAMUSCULAR | Status: DC | PRN

## 2016-09-23 MED ORDER — DEXAMETHASONE SODIUM PHOSPHATE 4 MG/ML IJ SOLN
INTRAMUSCULAR | Status: AC
  Filled 2016-09-23: qty 1

## 2016-09-23 MED ORDER — IBUPROFEN 800 MG PO TABS: 800.0000 mg | ORAL_TABLET | Freq: Three times a day (TID) | ORAL | 1 refills | Status: DC | PRN

## 2016-09-23 MED ORDER — LIDOCAINE HCL (CARDIAC) 20 MG/ML IV SOLN
INTRAVENOUS | Status: AC
  Filled 2016-09-23: qty 5

## 2016-09-23 MED ORDER — DEXAMETHASONE SODIUM PHOSPHATE 10 MG/ML IJ SOLN
INTRAMUSCULAR | Status: DC | PRN
  Administered 2016-09-23: 4 mg via INTRAVENOUS

## 2016-09-23 MED ORDER — PROPOFOL 10 MG/ML IV BOLUS
INTRAVENOUS | Status: AC
  Filled 2016-09-23: qty 20

## 2016-09-23 MED ORDER — PROPOFOL 10 MG/ML IV BOLUS
INTRAVENOUS | Status: DC | PRN
  Administered 2016-09-23: 200 mg via INTRAVENOUS

## 2016-09-23 MED ORDER — SCOPOLAMINE 1 MG/3DAYS TD PT72
1.0000 | MEDICATED_PATCH | Freq: Once | TRANSDERMAL | Status: DC
  Administered 2016-09-23: 1.5 mg via TRANSDERMAL

## 2016-09-23 MED ORDER — PROMETHAZINE HCL 25 MG/ML IJ SOLN: 6.2500 mg | INTRAMUSCULAR | Status: DC | PRN

## 2016-09-23 MED ORDER — ONDANSETRON HCL 4 MG/2ML IJ SOLN
INTRAMUSCULAR | Status: DC | PRN
  Administered 2016-09-23: 4 mg via INTRAVENOUS

## 2016-09-23 MED ORDER — KETOROLAC TROMETHAMINE 30 MG/ML IJ SOLN
INTRAMUSCULAR | Status: DC | PRN
  Administered 2016-09-23: 30 mg via INTRAMUSCULAR
  Administered 2016-09-23: 30 mg via INTRAVENOUS

## 2016-09-23 MED ORDER — FENTANYL CITRATE (PF) 100 MCG/2ML IJ SOLN
INTRAMUSCULAR | Status: AC
  Filled 2016-09-23: qty 2

## 2016-09-23 MED ORDER — MIDAZOLAM HCL 2 MG/2ML IJ SOLN
INTRAMUSCULAR | Status: DC | PRN
  Administered 2016-09-23: 2 mg via INTRAVENOUS

## 2016-09-23 MED ORDER — MIDAZOLAM HCL 2 MG/2ML IJ SOLN
INTRAMUSCULAR | Status: AC
  Filled 2016-09-23: qty 2

## 2016-09-23 MED ORDER — DEXAMETHASONE SODIUM PHOSPHATE 4 MG/ML IJ SOLN
INTRAMUSCULAR | Status: AC
Start: 2016-09-23 — End: ?
  Filled 2016-09-23: qty 1

## 2016-09-23 MED ORDER — ONDANSETRON HCL 4 MG/2ML IJ SOLN
INTRAMUSCULAR | Status: AC
  Filled 2016-09-23: qty 2

## 2016-09-23 MED ORDER — LIDOCAINE HCL (CARDIAC) 20 MG/ML IV SOLN
INTRAVENOUS | Status: DC | PRN
  Administered 2016-09-23: 50 mg via INTRAVENOUS

## 2016-09-23 MED ORDER — LACTATED RINGERS IV SOLN
INTRAVENOUS | Status: DC
  Administered 2016-09-23: 125 mL/h via INTRAVENOUS

## 2016-09-23 SURGICAL SUPPLY — 17 items
CANISTER SUCT 3000ML PPV (MISCELLANEOUS) ×5 IMPLANT
CATH ROBINSON RED A/P 16FR (CATHETERS) ×3 IMPLANT
CLOTH BEACON ORANGE TIMEOUT ST (SAFETY) ×3 IMPLANT
CONTAINER PREFILL 10% NBF 60ML (FORM) ×6 IMPLANT
DEVICE MYOSURE LITE (MISCELLANEOUS) IMPLANT
DEVICE MYOSURE REACH (MISCELLANEOUS) ×2 IMPLANT
FILTER ARTHROSCOPY CONVERTOR (FILTER) ×3 IMPLANT
GLOVE BIOGEL PI IND STRL 7.0 (GLOVE) ×2 IMPLANT
GLOVE BIOGEL PI INDICATOR 7.0 (GLOVE) ×4
GLOVE ECLIPSE 6.5 STRL STRAW (GLOVE) ×3 IMPLANT
GOWN STRL REUS W/TWL LRG LVL3 (GOWN DISPOSABLE) ×6 IMPLANT
PACK VAGINAL MINOR WOMEN LF (CUSTOM PROCEDURE TRAY) ×3 IMPLANT
PAD OB MATERNITY 4.3X12.25 (PERSONAL CARE ITEMS) ×3 IMPLANT
SEAL ROD LENS SCOPE MYOSURE (ABLATOR) ×3 IMPLANT
TOWEL OR 17X24 6PK STRL BLUE (TOWEL DISPOSABLE) ×6 IMPLANT
TUBING AQUILEX INFLOW (TUBING) ×3 IMPLANT
TUBING AQUILEX OUTFLOW (TUBING) ×3 IMPLANT

## 2016-09-23 NOTE — Anesthesia Preprocedure Evaluation (Addendum)
Anesthesia Evaluation  Patient identified by MRN, date of birth, ID band Patient awake    Reviewed: Allergy & Precautions, NPO status , Patient's Chart, lab work & pertinent test results  Airway Mallampati: III  TM Distance: >3 FB Neck ROM: Full    Dental  (+) Teeth Intact, Dental Advisory Given   Pulmonary neg pulmonary ROS,    Pulmonary exam normal breath sounds clear to auscultation       Cardiovascular hypertension, Pt. on medications Normal cardiovascular exam Rhythm:Regular Rate:Normal     Neuro/Psych  Headaches, negative psych ROS   GI/Hepatic Neg liver ROS, hiatal hernia,   Endo/Other  diabetes, Type 2Obesity   Renal/GU negative Renal ROS     Musculoskeletal negative musculoskeletal ROS (+)   Abdominal   Peds  Hematology negative hematology ROS (+)   Anesthesia Other Findings Day of surgery medications reviewed with the patient.  Reproductive/Obstetrics Abnormal Perimenopausal Bleeding, Endometrial Mass                            Anesthesia Physical Anesthesia Plan  ASA: II  Anesthesia Plan: General   Post-op Pain Management:    Induction: Intravenous  PONV Risk Score and Plan: 3 and Ondansetron, Dexamethasone, Midazolam and Scopolamine patch - Pre-op  Airway Management Planned: LMA  Additional Equipment:   Intra-op Plan:   Post-operative Plan: Extubation in OR  Informed Consent: I have reviewed the patients History and Physical, chart, labs and discussed the procedure including the risks, benefits and alternatives for the proposed anesthesia with the patient or authorized representative who has indicated his/her understanding and acceptance.   Dental advisory given  Plan Discussed with: CRNA  Anesthesia Plan Comments: (Risks/benefits of general anesthesia discussed with patient including risk of damage to teeth, lips, gum, and tongue, nausea/vomiting, allergic  reactions to medications, and the possibility of heart attack, stroke and death.  All patient questions answered.  Patient wishes to proceed.)        Anesthesia Quick Evaluation

## 2016-09-23 NOTE — Anesthesia Postprocedure Evaluation (Signed)
Anesthesia Post Note  Patient: Alexa Savage  Procedure(s) Performed: Procedure(s) (LRB): DILATATION & CURETTAGE/HYSTEROSCOPY WITH MYOSURE (N/A)     Patient location during evaluation: PACU Anesthesia Type: General Level of consciousness: awake and alert Pain management: pain level controlled Vital Signs Assessment: post-procedure vital signs reviewed and stable Respiratory status: spontaneous breathing, nonlabored ventilation and respiratory function stable Cardiovascular status: blood pressure returned to baseline and stable Postop Assessment: no signs of nausea or vomiting Anesthetic complications: no    Last Vitals:  Vitals:   09/23/16 1300 09/23/16 1345  BP: 126/88 (!) 132/45  Pulse: 68 73  Resp: 14 20  Temp: 37.1 C   SpO2: 100% 99%    Last Pain:  Vitals:   09/23/16 1300  TempSrc:   PainSc: 0-No pain   Pain Goal: Patients Stated Pain Goal: 3 (09/23/16 1207)               Catalina Gravel

## 2016-09-23 NOTE — Transfer of Care (Signed)
Immediate Anesthesia Transfer of Care Note  Patient: Alexa Savage  Procedure(s) Performed: Procedure(s): DILATATION & CURETTAGE/HYSTEROSCOPY WITH MYOSURE (N/A)  Patient Location: PACU  Anesthesia Type:General  Level of Consciousness: awake, alert  and oriented  Airway & Oxygen Therapy: Patient Spontanous Breathing and Patient connected to nasal cannula oxygen  Post-op Assessment: Report given to RN and Post -op Vital signs reviewed and stable  Post vital signs: Reviewed and stable  Last Vitals:  Vitals:   09/23/16 1300 09/23/16 1345  BP: 126/88 (!) 132/45  Pulse: 68 73  Resp: 14 20  Temp: 37.1 C   SpO2: 100% 99%    Last Pain:  Vitals:   09/23/16 1300  TempSrc:   PainSc: 0-No pain      Patients Stated Pain Goal: 3 (12/15/57 4585)  Complications: No apparent anesthesia complications

## 2016-09-23 NOTE — Brief Op Note (Signed)
09/23/2016  12:03 PM  PATIENT:  Alexa Savage  46 y.o. female  PRE-OPERATIVE DIAGNOSIS: menorrhagia with irregular cycles,  Endometrial Mass  POST-OPERATIVE DIAGNOSIS:  Menorrhagia with irregular cycles, , Endometrial polyps  PROCEDURE:  Diagnostic hysteroscopy, hysteroscopic resection of endometrial polys, dilation and curettage  SURGEON:  Surgeon(s) and Role:    * Servando Salina, MD - Primary  PHYSICIAN ASSISTANT:   ASSISTANTS: none   ANESTHESIA:   general Findings; tubal ostial seen. Endometrial polyps and endometrial thickening EBL:  Total I/O In: 600 [I.V.:600] Out: 200 [Urine:150; Blood:50]  BLOOD ADMINISTERED:none  DRAINS: none   LOCAL MEDICATIONS USED:  NONE  SPECIMEN:  Source of Specimen:  emc with polyps  DISPOSITION OF SPECIMEN:  PATHOLOGY  COUNTS:  YES  TOURNIQUET:  * No tourniquets in log *  DICTATION: .Other Dictation: Dictation Number [id number]  PLAN OF CARE: Discharge to home after PACU  PATIENT DISPOSITION:  PACU - hemodynamically stable.   Delay start of Pharmacological VTE agent (>24hrs) due to surgical blood loss or risk of bleeding: no

## 2016-09-23 NOTE — H&P (Signed)
Alexa Savage is an 46 y.o. female G0 BF presents for diagnostic hysteroscopy, D&C, endometrial mass due to sonohysterogram finding of endometrial mass. Pt has had irregular menstrual cycles  Pertinent Gynecological History: Menses: menorrhagia with irregular cycle Bleeding: dysfunctional uterine bleeding Contraception: none DES exposure: denies Blood transfusions: none Sexually transmitted diseases: no past history Previous GYN Procedures: myomectomy  Last mammogram: normal Date: 12/2015 Last pap: normal Date: 03/12/2016 OB History: G0   Menstrual History: Menarche age: n/a Patient's last menstrual period was 08/31/2016 (approximate).    Past Medical History:  Diagnosis Date  . Allergy   . Anemia   . Bilateral lower extremity edema    occ  . Breast cyst    right  . Diabetes mellitus without complication (HCC)    borderline  . H/O hiatal hernia   . Headache    migraines  . Hypertension   . Sinusitis     Past Surgical History:  Procedure Laterality Date  . MYOMECTOMY N/A 11/26/2013   Procedure: Exploratory Laparotomy MYOMECTOMY;  Surgeon: Marvene Staff, MD;  Location: [facility];  Service: Gynecology;  Laterality: N/A;  . WISDOM TOOTH EXTRACTION      Family History  Problem Relation Age of Onset  . Hypertension Mother   . Heart disease Father   . Cancer Maternal Aunt        Breast Cancer  . Hypertension Maternal Aunt   . Diabetes Maternal Grandmother   . Asthma Neg Hx     Social History:  reports that she has never smoked. She has never used smokeless tobacco. She reports that she does not drink alcohol or use drugs.  Allergies:  Allergies  Allergen Reactions  . Penicillins Hives and Rash    Has patient had a PCN reaction causing immediate rash, facial/tongue/throat swelling, SOB or lightheadedness with hypotension: Yes Has patient had a PCN reaction causing severe rash involving mucus membranes or skin necrosis: Yes Has patient had a PCN reaction  that required hospitalization: No Has patient had a PCN reaction occurring within the last 10 years: No If all of the above answers are "NO", then may proceed with Cephalosporin use.     No prescriptions prior to admission.    Review of Systems  All other systems reviewed and are negative.   Last menstrual period 08/31/2016. Physical Exam  Constitutional: She is oriented to person, place, and time. She appears well-developed and well-nourished.  HENT:  Head: Atraumatic.  Neck: Neck supple.  Cardiovascular: Normal rate.   Respiratory: Breath sounds normal.  GI: Soft.  Genitourinary: Vagina normal.  Genitourinary Comments: Adnexa no palp mass Uterus Av sl enlarged Cervix closed  Musculoskeletal: She exhibits no edema.  Neurological: She is alert and oriented to person, place, and time.  Skin: Skin is warm and dry.  Psychiatric: She has a normal mood and affect.    Results for orders placed or performed during the hospital encounter of 09/23/16 (from the past 24 hour(s))  Basic metabolic panel     Status: Abnormal   Collection Time: 09/22/16  3:55 PM  Result Value Ref Range   Sodium 141 135 - 145 mmol/L   Potassium 4.3 3.5 - 5.1 mmol/L   Chloride 106 101 - 111 mmol/L   CO2 27 22 - 32 mmol/L   Glucose, Bld 97 65 - 99 mg/dL   BUN 13 6 - 20 mg/dL   Creatinine, Ser 1.03 (H) 0.44 - 1.00 mg/dL   Calcium 10.2 8.9 - 10.3 mg/dL  GFR calc non Af Amer >60 >60 mL/min   GFR calc Af Amer >60 >60 mL/min   Anion gap 8 5 - 15  CBC     Status: None   Collection Time: 09/22/16  3:55 PM  Result Value Ref Range   WBC 5.9 4.0 - 10.5 K/uL   RBC 4.21 3.87 - 5.11 MIL/uL   Hemoglobin 12.2 12.0 - 15.0 g/dL   HCT 37.4 36.0 - 46.0 %   MCV 88.8 78.0 - 100.0 fL   MCH 29.0 26.0 - 34.0 pg   MCHC 32.6 30.0 - 36.0 g/dL   RDW 14.2 11.5 - 15.5 %   Platelets 282 150 - 400 K/uL    No results found.  Assessment/Plan: Irregular menstrual cycle Endometrial thickening on sonogram Endometrial  mass P) dx hysteroscopy, D&C, endometrial mass removal. Risk of surgery includes infection, bleeding, uterine perforation and its risk, thermal injury, fluid overload and its mgmt, injury to surrounding organ structures  Samarth Ogle A 09/23/2016, 6:22 AM

## 2016-09-23 NOTE — Discharge Instructions (Signed)
DISCHARGE INSTRUCTIONS: D&C / D&E The following instructions have been prepared to help you care for yourself upon your return home.   Personal hygiene:  Use sanitary pads for vaginal drainage, not tampons.  Shower the day after your procedure.  NO tub baths, pools or Jacuzzis for 2-3 weeks.  Wipe front to back after using the bathroom.  Activity and limitations:  Do NOT drive or operate any equipment for 24 hours. The effects of anesthesia are still present and drowsiness may result.  Do NOT rest in bed all day.  Walking is encouraged.  Walk up and down stairs slowly.  You may resume your normal activity in one to two days or as indicated by your physician.  Sexual activity: NO intercourse for at least 2 weeks after the procedure, or as indicated by your physician.  Diet: Eat a light meal as desired this evening. You may resume your usual diet tomorrow.  Return to work: You may resume your work activities in one to two days or as indicated by your doctor.  What to expect after your surgery: Expect to have vaginal bleeding/discharge for 2-3 days and spotting for up to 10 days. It is not unusual to have soreness for up to 1-2 weeks. You may have a slight burning sensation when you urinate for the first day. Mild cramps may continue for a couple of days. You may have a regular period in 2-6 weeks.  Call your doctor for any of the following:  Excessive vaginal bleeding, saturating and changing one pad every hour.  Inability to urinate 6 hours after discharge from hospital.  Pain not relieved by pain medication.  Fever of 100.4 F or greater.  Unusual vaginal discharge or odor.  Can take ibuprofen /motrin/advil at 5PM today  09/23/16  Increase oral fluids like water next 48 hours  Can use heating pad to abdomen   Call for an appointment:    Patients signature: ______________________  Nurses signature ________________________  Support person's  signature_______________________     Post Anesthesia Home Care Instructions  Activity: Get plenty of rest for the remainder of the day. A responsible individual must stay with you for 24 hours following the procedure.  For the next 24 hours, DO NOT: -Drive a car -Paediatric nurse -Drink alcoholic beverages -Take any medication unless instructed by your physician -Make any legal decisions or sign important papers.  Meals: Start with liquid foods such as gelatin or soup. Progress to regular foods as tolerated. Avoid greasy, spicy, heavy foods. If nausea and/or vomiting occur, drink only clear liquids until the nausea and/or vomiting subsides. Call your physician if vomiting continues.  Special Instructions/Symptoms: Your throat may feel dry or sore from the anesthesia or the breathing tube placed in your throat during surgery. If this causes discomfort, gargle with warm salt water. The discomfort should disappear within 24 hours.  If you had a scopolamine patch placed behind your ear for the management of post- operative nausea and/or vomiting:  1. The medication in the patch is effective for 72 hours, after which it should be removed.  Wrap patch in a tissue and discard in the trash. Wash hands thoroughly with soap and water. 2. You may remove the patch earlier than 72 hours if you experience unpleasant side effects which may include dry mouth, dizziness or visual disturbances. 3. Avoid touching the patch. Wash your hands with soap and water after contact with the patch.

## 2016-09-23 NOTE — Anesthesia Procedure Notes (Signed)
Procedure Name: LMA Insertion Date/Time: 09/23/2016 11:24 AM Performed by: Bufford Spikes Pre-anesthesia Checklist: Patient identified, Emergency Drugs available, Suction available and Patient being monitored Patient Re-evaluated:Patient Re-evaluated prior to induction Oxygen Delivery Method: Circle system utilized Preoxygenation: Pre-oxygenation with 100% oxygen Induction Type: IV induction Ventilation: Mask ventilation without difficulty LMA: LMA inserted LMA Size: 4.0 Number of attempts: 1 Placement Confirmation: positive ETCO2 Tube secured with: Tape Dental Injury: Teeth and Oropharynx as per pre-operative assessment

## 2016-09-24 ENCOUNTER — Encounter (HOSPITAL_COMMUNITY): Payer: Self-pay | Admitting: Obstetrics and Gynecology

## 2016-09-24 NOTE — Op Note (Signed)
NAMEYAILYN, Alexa Savage NO.:  [id number]  MEDICAL RECORD NO.:  [id number]  LOCATION:                                 FACILITY:  PHYSICIAN:  Servando Salina, M.D.    DATE OF BIRTH:  DATE OF PROCEDURE:  09/23/2016 DATE OF DISCHARGE:                              OPERATIVE REPORT   PREOPERATIVE DIAGNOSES: 1. Menorrhagia with irregular cycles. 2. Endometrial mass.  PROCEDURES: 1. Diagnostic hysteroscopy. 2. Hysteroscopic resection of endometrial polyps. 3. Dilation and curettage.  POSTOPERATIVE DIAGNOSES: 1. Menorrhagia with irregular cycles. 2. Endometrial mass.  ANESTHESIA:  General.  SURGEON:  Servando Salina, M.D.  ASSISTANTS:  None.  DESCRIPTION OF PROCEDURE:  Under adequate general anesthesia, the patient was placed in dorsal lithotomy position.  She was sterilely prepped and draped in usual fashion.  Bladder was catheterized for moderate amount of urine.  Examination under anesthesia revealed a retroverted uterus.  No adnexal masses could be appreciated.  Bivalve speculum was placed in the vagina.  Single-tooth tenaculum was placed on the anterior lip of the cervix.  Cervix easily dilated up to #[facility] dilator.  A hysteroscopy was introduced into the uterine cavity.  On finding the uterine cavity, several polypoid lesions were noted as well as endometrial thickening.  Using the Reach MyoSure resectoscope, all polypoid lesions and endometrial thickening were resected.  Tubal ostia were both seen at that point.  The endocervical canal was inspected, no lesions noted.  The resectoscope was removed.  The cavity was then curetted for a scant amount of tissue.  All instruments were then removed from the vagina.  SPECIMEN LABELED:  Endometrial polyps with curettings were sent to Pathology.  ESTIMATED BLOOD LOSS:  Minimal.  COMPLICATIONS:  None.  DISPOSITION:  The patient tolerated the procedure well, was transferred to Recovery in stable  condition.     Servando Salina, M.D.   ______________________________ Servando Salina, M.D.    [place]/MEDQ  D:  09/23/2016  T:  09/24/2016  Job:  [id number]

## 2016-09-29 ENCOUNTER — Telehealth: Payer: Self-pay | Admitting: Family Medicine

## 2016-09-29 DIAGNOSIS — R7303 Prediabetes: Secondary | ICD-10-CM

## 2016-09-29 DIAGNOSIS — I10 Essential (primary) hypertension: Secondary | ICD-10-CM

## 2016-09-29 DIAGNOSIS — E669 Obesity, unspecified: Secondary | ICD-10-CM

## 2016-09-29 NOTE — Telephone Encounter (Signed)
Labs ordered as directed. 

## 2016-10-01 NOTE — Addendum Note (Signed)
Addendum  created 10/01/16 1556 by Lucretia Kern D, CRNA   Charge Capture section accepted

## 2016-10-28 ENCOUNTER — Ambulatory Visit: Payer: BC Managed Care – PPO | Admitting: Family Medicine

## 2016-11-15 LAB — LIPID PANEL
Cholesterol: 180 mg/dL (ref <200)
HDL: 75 mg/dL (ref >50)
LDL Cholesterol (Calc): 91 mg/dL (calc)
NON-HDL CHOLESTEROL (CALC): 105 mg/dL (ref <130)
Total CHOL/HDL Ratio: 2.4 (calc) (ref <5.0)
Triglycerides: 49 mg/dL (ref <150)

## 2016-11-15 LAB — VITAMIN D 25 HYDROXY (VIT D DEFICIENCY, FRACTURES): VIT D 25 HYDROXY: 18 ng/mL — AB (ref 30–100)

## 2016-11-15 LAB — HEMOGLOBIN A1C
HEMOGLOBIN A1C: 5.1 %{Hb} (ref <5.7)
Mean Plasma Glucose: 100 (calc)
eAG (mmol/L): 5.5 (calc)

## 2016-11-15 LAB — TSH: TSH: 1.08 m[IU]/L

## 2016-11-17 ENCOUNTER — Encounter: Payer: Self-pay | Admitting: Family Medicine

## 2016-12-27 ENCOUNTER — Ambulatory Visit: Payer: BC Managed Care – PPO | Admitting: Family Medicine

## 2016-12-27 ENCOUNTER — Encounter: Payer: Self-pay | Admitting: Family Medicine

## 2016-12-27 VITALS — BP 160/100 | HR 86 | Resp 16 | Ht 65.0 in | Wt 212.0 lb

## 2016-12-27 DIAGNOSIS — E559 Vitamin D deficiency, unspecified: Secondary | ICD-10-CM

## 2016-12-27 DIAGNOSIS — E669 Obesity, unspecified: Secondary | ICD-10-CM

## 2016-12-27 DIAGNOSIS — I10 Essential (primary) hypertension: Secondary | ICD-10-CM | POA: Diagnosis not present

## 2016-12-27 DIAGNOSIS — J3089 Other allergic rhinitis: Secondary | ICD-10-CM

## 2016-12-27 MED ORDER — TRIAMTERENE-HCTZ 37.5-25 MG PO TABS: ORAL_TABLET | ORAL | 3 refills | Status: DC

## 2016-12-27 NOTE — Patient Instructions (Addendum)
F/u 2nd week in January, with rectal exam,  call if you need me sooner  Increase maxzide to one and a half tablet once daily , your blood pressure is high  Fasting chem7  1 week before visit  It is important that you exercise regularly at least 30 minutes 5 times a week. If you develop chest pain, have severe difficulty breathing, or feel very tired, stop exercising immediately and seek medical attention    Limit sodium and stop canned foods  Weight loss  Goal of 4 pounds is hoped for  Increase vegetable and fruit intake less processed  Food     DASH Eating Plan DASH stands for "Dietary Approaches to Stop Hypertension." The DASH eating plan is a healthy eating plan that has been shown to reduce high blood pressure (hypertension). It may also reduce your risk for type 2 diabetes, heart disease, and stroke. The DASH eating plan may also help with weight loss. What are tips for following this plan? General guidelines  Avoid eating more than 2,300 mg (milligrams) of salt (sodium) a day. If you have hypertension, you may need to reduce your sodium intake to 1,500 mg a day.  Limit alcohol intake to no more than 1 drink a day for nonpregnant women and 2 drinks a day for men. One drink equals 12 oz of beer, 5 oz of wine, or 1 oz of hard liquor.  Work with your health care provider to maintain a healthy body weight or to lose weight. Ask what an ideal weight is for you.  Get at least 30 minutes of exercise that causes your heart to beat faster (aerobic exercise) most days of the week. Activities may include walking, swimming, or biking.  Work with your health care provider or diet and nutrition specialist (dietitian) to adjust your eating plan to your individual calorie needs. Reading food labels  Check food labels for the amount of sodium per serving. Choose foods with less than 5 percent of the Daily Value of sodium. Generally, foods with less than 300 mg of sodium per serving fit into  this eating plan.  To find whole grains, look for the word "whole" as the first word in the ingredient list. Shopping  Buy products labeled as "low-sodium" or "no salt added."  Buy fresh foods. Avoid canned foods and premade or frozen meals. Cooking  Avoid adding salt when cooking. Use salt-free seasonings or herbs instead of table salt or sea salt. Check with your health care provider or pharmacist before using salt substitutes.  Do not fry foods. Cook foods using healthy methods such as baking, boiling, grilling, and broiling instead.  Cook with heart-healthy oils, such as olive, canola, soybean, or sunflower oil. Meal planning   Eat a balanced diet that includes: ? 5 or more servings of fruits and vegetables each day. At each meal, try to fill half of your plate with fruits and vegetables. ? Up to 6-8 servings of whole grains each day. ? Less than 6 oz of lean meat, poultry, or fish each day. A 3-oz serving of meat is about the same size as a deck of cards. One egg equals 1 oz. ? 2 servings of low-fat dairy each day. ? A serving of nuts, seeds, or beans 5 times each week. ? Heart-healthy fats. Healthy fats called Omega-3 fatty acids are found in foods such as flaxseeds and coldwater fish, like sardines, salmon, and mackerel.  Limit how much you eat of the following: ? Canned or  prepackaged foods. ? Food that is high in trans fat, such as fried foods. ? Food that is high in saturated fat, such as fatty meat. ? Sweets, desserts, sugary drinks, and other foods with added sugar. ? Full-fat dairy products.  Do not salt foods before eating.  Try to eat at least 2 vegetarian meals each week.  Eat more home-cooked food and less restaurant, buffet, and fast food.  When eating at a restaurant, ask that your food be prepared with less salt or no salt, if possible. What foods are recommended? The items listed may not be a complete list. Talk with your dietitian about what dietary  choices are best for you. Grains Whole-grain or whole-wheat bread. Whole-grain or whole-wheat pasta. Brown rice. [place]. Bulgur. Whole-grain and low-sodium cereals. Pita bread. Low-fat, low-sodium crackers. Whole-wheat flour tortillas. Vegetables Fresh or frozen vegetables (raw, steamed, roasted, or grilled). Low-sodium or reduced-sodium tomato and vegetable juice. Low-sodium or reduced-sodium tomato sauce and tomato paste. Low-sodium or reduced-sodium canned vegetables. Fruits All fresh, dried, or frozen fruit. Canned fruit in natural juice (without added sugar). Meat and other protein foods Skinless chicken or [place]. Ground chicken or [place]. Pork with fat trimmed off. Fish and seafood. Egg whites. Dried beans, peas, or lentils. Unsalted nuts, nut butters, and seeds. Unsalted canned beans. Lean cuts of beef with fat trimmed off. Low-sodium, lean deli meat. Dairy Low-fat (1%) or fat-free (skim) milk. Fat-free, low-fat, or reduced-fat cheeses. Nonfat, low-sodium ricotta or cottage cheese. Low-fat or nonfat yogurt. Low-fat, low-sodium cheese. Fats and oils Soft margarine without trans fats. Vegetable oil. Low-fat, reduced-fat, or light mayonnaise and salad dressings (reduced-sodium). Canola, safflower, olive, soybean, and sunflower oils. Avocado. Seasoning and other foods Herbs. Spices. Seasoning mixes without salt. Unsalted popcorn and pretzels. Fat-free sweets. What foods are not recommended? The items listed may not be a complete list. Talk with your dietitian about what dietary choices are best for you. Grains Baked goods made with fat, such as croissants, muffins, or some breads. Dry pasta or rice meal packs. Vegetables Creamed or fried vegetables. Vegetables in a cheese sauce. Regular canned vegetables (not low-sodium or reduced-sodium). Regular canned tomato sauce and paste (not low-sodium or reduced-sodium). Regular tomato and vegetable juice (not low-sodium or reduced-sodium).  Angie Fava. Olives. Fruits Canned fruit in a light or heavy syrup. Fried fruit. Fruit in cream or butter sauce. Meat and other protein foods Fatty cuts of meat. Ribs. Fried meat. Berniece Salines. Sausage. Bologna and other processed lunch meats. Salami. Fatback. Hotdogs. Bratwurst. Salted nuts and seeds. Canned beans with added salt. Canned or smoked fish. Whole eggs or egg yolks. Chicken or [place] with skin. Dairy Whole or 2% milk, cream, and half-and-half. Whole or full-fat cream cheese. Whole-fat or sweetened yogurt. Full-fat cheese. Nondairy creamers. Whipped toppings. Processed cheese and cheese spreads. Fats and oils Butter. Stick margarine. Lard. Shortening. Ghee. Bacon fat. Tropical oils, such as coconut, palm kernel, or palm oil. Seasoning and other foods Salted popcorn and pretzels. Onion salt, garlic salt, seasoned salt, table salt, and sea salt. Worcestershire sauce. Tartar sauce. Barbecue sauce. Teriyaki sauce. Soy sauce, including reduced-sodium. Steak sauce. Canned and packaged gravies. Fish sauce. Oyster sauce. Cocktail sauce. Horseradish that you find on the shelf. Ketchup. Mustard. Meat flavorings and tenderizers. Bouillon cubes. Hot sauce and Tabasco sauce. Premade or packaged marinades. Premade or packaged taco seasonings. Relishes. Regular salad dressings. Where to find more information:  National Heart, Lung, and [place]: https://wilson-eaton.com/  American Heart Association: www.heart.org Summary  The DASH  eating plan is a healthy eating plan that has been shown to reduce high blood pressure (hypertension). It may also reduce your risk for type 2 diabetes, heart disease, and stroke.  With the DASH eating plan, you should limit salt (sodium) intake to 2,300 mg a day. If you have hypertension, you may need to reduce your sodium intake to 1,500 mg a day.  When on the DASH eating plan, aim to eat more fresh fruits and vegetables, whole grains, lean proteins, low-fat dairy, and  heart-healthy fats.  Work with your health care provider or diet and nutrition specialist (dietitian) to adjust your eating plan to your individual calorie needs. This information is not intended to replace advice given to you by your health care provider. Make sure you discuss any questions you have with your health care provider. Document Released: 01/21/2011 Document Revised: 01/26/2016 Document Reviewed: 01/26/2016 Elsevier Interactive Patient Education  2017 [employer].

## 2016-12-28 ENCOUNTER — Other Ambulatory Visit: Payer: Self-pay | Admitting: Family Medicine

## 2016-12-28 DIAGNOSIS — Z1231 Encounter for screening mammogram for malignant neoplasm of breast: Secondary | ICD-10-CM

## 2016-12-29 DIAGNOSIS — E559 Vitamin D deficiency, unspecified: Secondary | ICD-10-CM | POA: Insufficient documentation

## 2016-12-29 NOTE — Assessment & Plan Note (Signed)
Uncontrolled, increase maxzide to 1.5 tabs daily DASH diet and commitment to daily physical activity for a minimum of 30 minutes discussed and encouraged, as a part of hypertension management. The importance of attaining a healthy weight is also discussed.  BP/Weight 12/27/2016 09/23/2016 09/22/2016 03/10/2016 08/06/2015 06/18/2015 0/21/1173  Systolic BP 567 014 103 013 143 888 757  Diastolic BP 972 45 79 84 90 72 74  Wt. (Lbs) 212 - 203.25 199.12 207 206.13 205  BMI 35.28 - 33.82 33.14 34.45 34.76 34.11

## 2016-12-29 NOTE — Assessment & Plan Note (Signed)
Deteriorated. Patient re-educated about  the importance of commitment to a  minimum of 150 minutes of exercise per week.  The importance of healthy food choices with portion control discussed. Encouraged to start a food diary, count calories and to consider  joining a support group. Sample diet sheets offered. Goals set by the patient for the next several months.   Weight /BMI 12/27/2016 09/22/2016 03/10/2016  WEIGHT 212 lb 203 lb 4 oz 199 lb 1.9 oz  HEIGHT 5\' 5"  5\' 5"  5\' 5"   BMI 35.28 kg/m2 33.82 kg/m2 33.14 kg/m2

## 2016-12-29 NOTE — Assessment & Plan Note (Signed)
Commit to once daily vitamin D

## 2016-12-29 NOTE — Assessment & Plan Note (Signed)
No current flare 

## 2016-12-29 NOTE — Progress Notes (Signed)
   Alexa Savage     MRN: [id number]      DOB: June 05, 1970   HPI Alexa Savage is here for follow up and re-evaluation of chronic medical conditions, medication management and review of any available recent lab and radiology data.  Preventive health is updated, specifically  Cancer screening and Immunization.   Questions or concerns regarding consultations or procedures which the PT has had in the interim are  Addressed.Had cyst removed from ovary , heavy menses have improved since The PT denies any adverse reactions to current medications since the last visit.  There are no new concerns.  No exercise commitment, still has poor diet ,  Weight gain perisits  ROS Denies recent fever or chills. Denies sinus pressure, nasal congestion, ear pain or sore throat. Denies chest congestion, productive cough or wheezing. Denies chest pains, palpitations and leg swelling Denies abdominal pain, nausea, vomiting,diarrhea or constipation.   Denies dysuria, frequency, hesitancy or incontinence. Denies joint pain, swelling and limitation in mobility. Denies headaches, seizures, numbness, or tingling. Denies depression, anxiety or insomnia. Denies skin break down or rash.   PE  BP (!) 160/100   Pulse 86   Resp 16   Ht 5\' 5"  (1.651 m)   Wt 212 lb (96.2 kg)   SpO2 99%   BMI 35.28 kg/m   Patient alert and oriented and in no cardiopulmonary distress.  HEENT: No facial asymmetry, EOMI,   oropharynx pink and moist.  Neck supple no JVD, no mass.  Chest: Clear to auscultation bilaterally.  CVS: S1, S2 no murmurs, no S3.Regular rate.  ABD: Soft non tender.   Ext: No edema  MS: Adequate ROM spine, shoulders, hips and knees.  Skin: Intact, no ulcerations or rash noted.  Psych: Good eye contact, normal affect. Memory intact not anxious or depressed appearing.  CNS: CN 2-12 intact, power,  normal throughout.no focal deficits noted.   Assessment & Plan  HTN (hypertension) Uncontrolled,  increase maxzide to 1.5 tabs daily DASH diet and commitment to daily physical activity for a minimum of 30 minutes discussed and encouraged, as a part of hypertension management. The importance of attaining a healthy weight is also discussed.  BP/Weight 12/27/2016 09/23/2016 09/22/2016 03/10/2016 08/06/2015 06/18/2015 10/31/3844  Systolic BP 659 935 701 779 390 300 923  Diastolic BP 300 45 79 84 90 72 74  Wt. (Lbs) 212 - 203.25 199.12 207 206.13 205  BMI 35.28 - 33.82 33.14 34.45 34.76 34.11       Obesity (BMI 30.0-34.9) Deteriorated. Patient re-educated about  the importance of commitment to a  minimum of 150 minutes of exercise per week.  The importance of healthy food choices with portion control discussed. Encouraged to start a food diary, count calories and to consider  joining a support group. Sample diet sheets offered. Goals set by the patient for the next several months.   Weight /BMI 12/27/2016 09/22/2016 03/10/2016  WEIGHT 212 lb 203 lb 4 oz 199 lb 1.9 oz  HEIGHT 5\' 5"  5\' 5"  5\' 5"   BMI 35.28 kg/m2 33.82 kg/m2 33.14 kg/m2      Perennial and seasonal allergic rhinitis No current flare  Vitamin D deficiency Commit to once daily vitamin D

## 2017-01-28 ENCOUNTER — Ambulatory Visit
Admission: RE | Admit: 2017-01-28 | Discharge: 2017-01-28 | Disposition: A | Discharge status: Home / Self Care | Payer: BC Managed Care – PPO | Source: Ambulatory Visit | Attending: Family Medicine | Admitting: Family Medicine

## 2017-01-28 DIAGNOSIS — Z1231 Encounter for screening mammogram for malignant neoplasm of breast: Secondary | ICD-10-CM

## 2017-02-22 ENCOUNTER — Ambulatory Visit: Payer: BC Managed Care – PPO | Admitting: Family Medicine

## 2017-03-24 LAB — BASIC METABOLIC PANEL
BUN: 16 mg/dL (ref 7–25)
CO2: 28 mmol/L (ref 20–32)
CREATININE: 0.86 mg/dL (ref 0.50–1.10)
Calcium: 10.2 mg/dL (ref 8.6–10.2)
Chloride: 102 mmol/L (ref 98–110)
GLUCOSE: 94 mg/dL (ref 65–99)
Potassium: 4.8 mmol/L (ref 3.5–5.3)
SODIUM: 140 mmol/L (ref 135–146)

## 2017-03-28 ENCOUNTER — Ambulatory Visit: Payer: BC Managed Care – PPO | Admitting: Family Medicine

## 2017-03-28 ENCOUNTER — Encounter: Payer: Self-pay | Admitting: Family Medicine

## 2017-03-28 VITALS — BP 120/84 | HR 85 | Resp 16 | Ht 65.0 in | Wt 208.0 lb

## 2017-03-28 DIAGNOSIS — I10 Essential (primary) hypertension: Secondary | ICD-10-CM

## 2017-03-28 DIAGNOSIS — J3089 Other allergic rhinitis: Secondary | ICD-10-CM | POA: Diagnosis not present

## 2017-03-28 DIAGNOSIS — R232 Flushing: Secondary | ICD-10-CM | POA: Diagnosis not present

## 2017-03-28 DIAGNOSIS — E669 Obesity, unspecified: Secondary | ICD-10-CM

## 2017-03-28 NOTE — Progress Notes (Signed)
CHARM STENNER     MRN: [id number]      DOB: [birth year]   HPI Alexa Savage is here for follow up and re-evaluation of chronic medical conditions, medication management and review of any available recent lab and radiology data.  Preventive health is updated, specifically  Cancer screening and Immunization.   Questions or concerns regarding consultations or procedures which the PT has had in the interim are  addressed. The PT denies any adverse reactions to current medications since the last visit.  C/o hot flashes and irregular periods since she had ovarian cyst removed  ROS Denies recent fever or chills. Denies sinus pressure, nasal congestion, ear pain or sore throat. Denies chest congestion, productive cough or wheezing. Denies chest pains, palpitations and leg swelling Denies abdominal pain, nausea, vomiting,diarrhea or constipation.   Denies dysuria, frequency, hesitancy or incontinence. Denies joint pain, swelling and limitation in mobility. Denies headaches, seizures, numbness, or tingling. Denies depression, anxiety or insomnia. Denies skin break down or rash.   PE  BP 120/84   Pulse 85   Resp 16   Ht 5\' 5"  (1.651 m)   Wt 208 lb (94.3 kg)   SpO2 99%   BMI 34.61 kg/m   Patient alert and oriented and in no cardiopulmonary distress.  HEENT: No facial asymmetry, EOMI,   oropharynx pink and moist.  Neck supple no JVD, no mass.  Chest: Clear to auscultation bilaterally.  CVS: S1, S2 no murmurs, no S3.Regular rate.  ABD: Soft non tender.   Ext: No edema  MS: Adequate ROM spine, shoulders, hips and knees.  Skin: Intact, no ulcerations or rash noted.  Psych: Good eye contact, normal affect. Memory intact not anxious or depressed appearing.  CNS: CN 2-12 intact, power,  normal throughout.no focal deficits noted.   Assessment & Plan  HTN (hypertension) Controlled, no change in medication DASH diet and commitment to daily physical activity for a minimum of  30 minutes discussed and encouraged, as a part of hypertension management. The importance of attaining a healthy weight is also discussed.  BP/Weight 03/28/2017 12/27/2016 09/23/2016 09/22/2016 03/10/2016 06/18/5463 07/23/1273  Systolic BP 170 017 494 496 759 163 846  Diastolic BP 84 659 45 79 84 90 72  Wt. (Lbs) 208 212 - 203.25 199.12 207 206.13  BMI 34.61 35.28 - 33.82 33.14 34.45 34.76       Obesity (BMI 30.0-34.9) Improved Patient re-educated about  the importance of commitment to a  minimum of 150 minutes of exercise per week.  The importance of healthy food choices with portion control discussed. Encouraged to start a food diary, count calories and to consider  joining a support group. Sample diet sheets offered. Goals set by the patient for the next several months.   Weight /BMI 03/28/2017 12/27/2016 09/22/2016  WEIGHT 208 lb 212 lb 203 lb 4 oz  HEIGHT 5\' 5"  5\' 5"  5\' 5"   BMI 34.61 kg/m2 35.28 kg/m2 33.82 kg/m2      Perennial and seasonal allergic rhinitis Controlled, no change in medication   Prediabetes Patient educated about the importance of limiting  Carbohydrate intake , the need to commit to daily physical activity for a minimum of 30 minutes , and to commit weight loss. The fact that changes in all these areas will reduce or eliminate all together the development of diabetes is stressed.  Resolved, which is great Diabetic Labs Latest Ref Rng & Units 03/24/2017 11/13/2016 09/22/2016 08/04/2015 06/21/2015  HbA1c <5.7 % of total Hgb -  5.1 - 5.7(H) -  Chol <200 mg/dL - 180 - 190 -  HDL >50 mg/dL - 75 - 85 -  Calc LDL <130 mg/dL - - - 90 -  Triglycerides <150 mg/dL - 49 - 76 -  Creatinine 0.50 - 1.10 mg/dL 0.86 - 1.03(H) 0.81 0.87   BP/Weight 03/28/2017 12/27/2016 09/23/2016 09/22/2016 03/10/2016 1/93/7902 4/0/9735  Systolic BP 329 924 268 341 962 229 798  Diastolic BP 84 921 45 79 84 90 72  Wt. (Lbs) 208 212 - 203.25 199.12 207 206.13  BMI 34.61 35.28 - 33.82 33.14 34.45 34.76    No flowsheet data found.    Hot flashes Patient education done , and a literature provided, conservative management only at this time

## 2017-03-28 NOTE — Patient Instructions (Addendum)
End September for CPE, call if you need me before  Continue current medications   Pls call in August for complete fasting labs due for September visit cBC, lipid, cmp and EGFr, HBA1C, tSH and vit D  It is important that you exercise regularly at least 30 minutes 5 times a week. If you develop chest pain, have severe difficulty breathing, or feel very tired, stop exercising immediately and seek medical attention   Increase vegetable and fruit intake  Congrtas on good blood pressure, labs and weight loss  Perimenopause Perimenopause is the time when your body begins to move into the menopause (no menstrual period for 12 straight months). It is a natural process. Perimenopause can begin 2-8 years before the menopause and usually lasts for 1 year after the menopause. During this time, your ovaries may or may not produce an egg. The ovaries vary in their production of estrogen and progesterone hormones each month. This can cause irregular menstrual periods, difficulty getting pregnant, vaginal bleeding between periods, and uncomfortable symptoms. What are the causes?  Irregular production of the ovarian hormones, estrogen and progesterone, and not ovulating every month. Other causes include:  Tumor of the pituitary gland in the brain.  Medical disease that affects the ovaries.  Radiation treatment.  Chemotherapy.  Unknown causes.  Heavy smoking and excessive alcohol intake can bring on perimenopause sooner.  What are the signs or symptoms?  Hot flashes.  Night sweats.  Irregular menstrual periods.  Decreased sex drive.  Vaginal dryness.  Headaches.  Mood swings.  Depression.  Memory problems.  Irritability.  Tiredness.  Weight gain.  Trouble getting pregnant.  The beginning of losing bone cells (osteoporosis).  The beginning of hardening of the arteries (atherosclerosis). How is this diagnosed? Your health care provider will make a diagnosis by analyzing  your age, menstrual history, and symptoms. He or she will do a physical exam and note any changes in your body, especially your female organs. Female hormone tests may or may not be helpful depending on the amount of female hormones you produce and when you produce them. However, other hormone tests may be helpful to rule out other problems. How is this treated? In some cases, no treatment is needed. The decision on whether treatment is necessary during the perimenopause should be made by you and your health care provider based on how the symptoms are affecting you and your lifestyle. Various treatments are available, such as:  Treating individual symptoms with a specific medicine for that symptom.  Herbal medicines that can help specific symptoms.  Counseling.  Group therapy.  Follow these instructions at home:  Keep track of your menstrual periods (when they occur, how heavy they are, how long between periods, and how long they last) as well as your symptoms and when they started.  Only take over-the-counter or prescription medicines as directed by your health care provider.  Sleep and rest.  Exercise.  Eat a diet that contains calcium (good for your bones) and soy (acts like the estrogen hormone).  Do not smoke.  Avoid alcoholic beverages.  Take vitamin supplements as recommended by your health care provider. Taking vitamin E may help in certain cases.  Take calcium and vitamin D supplements to help prevent bone loss.  Group therapy is sometimes helpful.  Acupuncture may help in some cases. Contact a health care provider if:  You have questions about any symptoms you are having.  You need a referral to a specialist (gynecologist, psychiatrist, or psychologist). Get  help right away if:  You have vaginal bleeding.  Your period lasts longer than 8 days.  Your periods are recurring sooner than 21 days.  You have bleeding after intercourse.  You have severe  depression.  You have pain when you urinate.  You have severe headaches.  You have vision problems. This information is not intended to replace advice given to you by your health care provider. Make sure you discuss any questions you have with your health care provider. Document Released: 03/11/2004 Document Revised: 07/10/2015 Document Reviewed: 08/31/2012 Elsevier Interactive Patient Education  2017 [employer].

## 2017-03-29 DIAGNOSIS — R232 Flushing: Secondary | ICD-10-CM | POA: Insufficient documentation

## 2017-03-29 NOTE — Assessment & Plan Note (Signed)
Improved Patient re-educated about  the importance of commitment to a  minimum of 150 minutes of exercise per week.  The importance of healthy food choices with portion control discussed. Encouraged to start a food diary, count calories and to consider  joining a support group. Sample diet sheets offered. Goals set by the patient for the next several months.   Weight /BMI 03/28/2017 12/27/2016 09/22/2016  WEIGHT 208 lb 212 lb 203 lb 4 oz  HEIGHT 5\' 5"  5\' 5"  5\' 5"   BMI 34.61 kg/m2 35.28 kg/m2 33.82 kg/m2

## 2017-03-29 NOTE — Assessment & Plan Note (Signed)
Patient educated about the importance of limiting  Carbohydrate intake , the need to commit to daily physical activity for a minimum of 30 minutes , and to commit weight loss. The fact that changes in all these areas will reduce or eliminate all together the development of diabetes is stressed.  Resolved, which is great Diabetic Labs Latest Ref Rng & Units 03/24/2017 11/13/2016 09/22/2016 08/04/2015 06/21/2015  HbA1c <5.7 % of total Hgb - 5.1 - 5.7(H) -  Chol <200 mg/dL - 180 - 190 -  HDL >50 mg/dL - 75 - 85 -  Calc LDL <130 mg/dL - - - 90 -  Triglycerides <150 mg/dL - 49 - 76 -  Creatinine 0.50 - 1.10 mg/dL 0.86 - 1.03(H) 0.81 0.87   BP/Weight 03/28/2017 12/27/2016 09/23/2016 09/22/2016 03/10/2016 2/82/0601 06/21/1535  Systolic BP 943 276 147 092 957 473 403  Diastolic BP 84 709 45 79 84 90 72  Wt. (Lbs) 208 212 - 203.25 199.12 207 206.13  BMI 34.61 35.28 - 33.82 33.14 34.45 34.76   No flowsheet data found.

## 2017-03-29 NOTE — Assessment & Plan Note (Signed)
Controlled, no change in medication  

## 2017-03-29 NOTE — Assessment & Plan Note (Signed)
Controlled, no change in medication DASH diet and commitment to daily physical activity for a minimum of 30 minutes discussed and encouraged, as a part of hypertension management. The importance of attaining a healthy weight is also discussed.  BP/Weight 03/28/2017 12/27/2016 09/23/2016 09/22/2016 03/10/2016 0/25/4270 07/17/3760  Systolic BP 831 517 616 073 710 626 948  Diastolic BP 84 546 45 79 84 90 72  Wt. (Lbs) 208 212 - 203.25 199.12 207 206.13  BMI 34.61 35.28 - 33.82 33.14 34.45 34.76

## 2017-03-29 NOTE — Assessment & Plan Note (Signed)
Patient education done , and a literature provided, conservative management only at this time

## 2017-11-14 ENCOUNTER — Encounter: Payer: BC Managed Care – PPO | Admitting: Family Medicine

## 2017-12-08 ENCOUNTER — Telehealth: Payer: Self-pay

## 2017-12-08 DIAGNOSIS — I10 Essential (primary) hypertension: Secondary | ICD-10-CM

## 2017-12-08 DIAGNOSIS — E559 Vitamin D deficiency, unspecified: Secondary | ICD-10-CM

## 2017-12-08 DIAGNOSIS — R7303 Prediabetes: Secondary | ICD-10-CM

## 2017-12-08 NOTE — Telephone Encounter (Signed)
Labs ordered.

## 2017-12-10 ENCOUNTER — Encounter: Payer: Self-pay | Admitting: Family Medicine

## 2017-12-10 LAB — COMPLETE METABOLIC PANEL WITH GFR
AG RATIO: 1.7 (calc) (ref 1.0–2.5)
ALBUMIN MSPROF: 4.3 g/dL (ref 3.6–5.1)
ALT: 17 U/L (ref 6–29)
AST: 17 U/L (ref 10–35)
Alkaline phosphatase (APISO): 76 U/L (ref 33–115)
BUN: 11 mg/dL (ref 7–25)
CALCIUM: 9.6 mg/dL (ref 8.6–10.2)
CO2: 28 mmol/L (ref 20–32)
CREATININE: 0.79 mg/dL (ref 0.50–1.10)
Chloride: 104 mmol/L (ref 98–110)
GFR, EST AFRICAN AMERICAN: 103 mL/min/{1.73_m2} (ref >=60)
GFR, EST NON AFRICAN AMERICAN: 89 mL/min/{1.73_m2} (ref >=60)
GLOBULIN: 2.6 g/dL (ref 1.9–3.7)
Glucose, Bld: 82 mg/dL (ref 65–99)
POTASSIUM: 4.2 mmol/L (ref 3.5–5.3)
SODIUM: 140 mmol/L (ref 135–146)
TOTAL PROTEIN: 6.9 g/dL (ref 6.1–8.1)
Total Bilirubin: 0.6 mg/dL (ref 0.2–1.2)

## 2017-12-10 LAB — CBC
HCT: 38.3 % (ref 35.0–45.0)
HEMOGLOBIN: 12.8 g/dL (ref 11.7–15.5)
MCH: 28.3 pg (ref 27.0–33.0)
MCHC: 33.4 g/dL (ref 32.0–36.0)
MCV: 84.7 fL (ref 80.0–100.0)
MPV: 10.5 fL (ref 7.5–12.5)
PLATELETS: 248 10*3/uL (ref 140–400)
RBC: 4.52 10*6/uL (ref 3.80–5.10)
RDW: 13 % (ref 11.0–15.0)
WBC: 5 10*3/uL (ref 3.8–10.8)

## 2017-12-10 LAB — HEMOGLOBIN A1C
HEMOGLOBIN A1C: 5.6 %{Hb} (ref <5.7)
MEAN PLASMA GLUCOSE: 114 (calc)
eAG (mmol/L): 6.3 (calc)

## 2017-12-10 LAB — LIPID PANEL
CHOL/HDL RATIO: 2.8 (calc) (ref <5.0)
CHOLESTEROL: 182 mg/dL (ref <200)
HDL: 66 mg/dL (ref >50)
LDL Cholesterol (Calc): 101 mg/dL (calc) — ABNORMAL HIGH
Non-HDL Cholesterol (Calc): 116 mg/dL (calc) (ref <130)
Triglycerides: 65 mg/dL (ref <150)

## 2017-12-10 LAB — VITAMIN D 25 HYDROXY (VIT D DEFICIENCY, FRACTURES): Vit D, 25-Hydroxy: 16 ng/mL — ABNORMAL LOW (ref 30–100)

## 2017-12-10 LAB — TSH: TSH: 1.39 mIU/L

## 2017-12-13 ENCOUNTER — Ambulatory Visit (INDEPENDENT_AMBULATORY_CARE_PROVIDER_SITE_OTHER): Payer: BC Managed Care – PPO | Admitting: Family Medicine

## 2017-12-13 ENCOUNTER — Encounter: Payer: Self-pay | Admitting: Family Medicine

## 2017-12-13 ENCOUNTER — Other Ambulatory Visit (HOSPITAL_COMMUNITY)
Admission: RE | Admit: 2017-12-13 | Discharge: 2017-12-13 | Disposition: A | Discharge status: Home / Self Care | Payer: BC Managed Care – PPO | Source: Ambulatory Visit | Attending: Family Medicine | Admitting: Family Medicine

## 2017-12-13 VITALS — BP 118/80 | HR 91 | Resp 16 | Ht 65.0 in | Wt 219.0 lb

## 2017-12-13 DIAGNOSIS — Z1231 Encounter for screening mammogram for malignant neoplasm of breast: Secondary | ICD-10-CM | POA: Diagnosis not present

## 2017-12-13 DIAGNOSIS — N898 Other specified noninflammatory disorders of vagina: Secondary | ICD-10-CM | POA: Diagnosis not present

## 2017-12-13 DIAGNOSIS — Z124 Encounter for screening for malignant neoplasm of cervix: Secondary | ICD-10-CM | POA: Diagnosis not present

## 2017-12-13 DIAGNOSIS — Z0000 Encounter for general adult medical examination without abnormal findings: Secondary | ICD-10-CM | POA: Diagnosis not present

## 2017-12-13 DIAGNOSIS — Z1211 Encounter for screening for malignant neoplasm of colon: Secondary | ICD-10-CM

## 2017-12-13 NOTE — Progress Notes (Signed)
    Alexa Savage     MRN: [id number]      DOB: 08/17/1970  HPI: Patient is in for annual physical exam. No other health concerns are discussed. Recent labs are reviewed. Immunization is reviewed   PE: BP 118/80   Pulse 91   Resp 16   Ht 5\' 5"  (1.651 m)   Wt 219 lb (99.3 kg)   LMP 08/31/2016 (Approximate)   SpO2 99%   BMI 36.44 kg/m   Pleasant  female, alert and oriented x 3, in no cardio-pulmonary distress. Afebrile. HEENT No facial trauma or asymetry. Sinuses non tender.  Extra occullar muscles intact,  External ears normal, tympanic membranes clear. Oropharynx moist, no exudate. Neck: supple, no adenopathy,JVD or thyromegaly.No bruits.  Chest: Clear to ascultation bilaterally.No crackles or wheezes. Non tender to palpation  Breast: No asymetry,no masses or lumps. No tenderness. No nipple discharge or inversion. No axillary or supraclavicular adenopathy  Cardiovascular system; Heart sounds normal,  S1 and  S2 ,no S3.  No murmur, or thrill. Apical beat not displaced Peripheral pulses normal.  Abdomen: Soft, non tender, no organomegaly or masses. No bruits. Bowel sounds normal. No guarding, tenderness or rebound. Cologuard request    GU: External genitalia normal female genitalia , normal female distribution of hair. No lesions. Urethral meatus normal in size, no  Prolapse, no lesions visibly  Present. Bladder non tender. Vagina pink and moist , with no visible lesions , increased thick white discharge present . Adequate pelvic support no  cystocele or rectocele noted Cervix pink and appears healthy, no lesions or ulcerations noted, no discharge noted from os Uterus enlarged, no adnexal masses, no cervical motion or adnexal tenderness.   Musculoskeletal exam: Full ROM of spine, hips , shoulders and knees. No deformity ,swelling or crepitus noted. No muscle wasting or atrophy.   Neurologic: Cranial nerves 2 to 12 intact. Power, tone ,sensation and  reflexes normal throughout. No disturbance in gait. No tremor.  Skin: Intact, no ulceration, erythema , scaling or rash noted. Pigmentation normal throughout  Psych; Normal mood and affect. Judgement and concentration normal. Slightly depressed about never conceiving and appears to have achieved menopause   Assessment & Plan:  Annual physical exam Annual exam as documented. Counseling done  re healthy lifestyle involving commitment to 150 minutes exercise per week, heart healthy diet, and attaining healthy weight.The importance of adequate sleep also discussed. Regular seat belt use and home safety, is also discussed. Changes in health habits are decided on by the patient with goals and time frames  set for achieving them. Immunization and cancer screening needs are specifically addressed at this visit.   Morbid obesity ([place]) Deteriorated. Patient re-educated about  the importance of commitment to a  minimum of 150 minutes of exercise per week.  The importance of healthy food choices with portion control discussed. Encouraged to start a food diary, count calories and to consider  joining a support group. Sample diet sheets offered. Goals set by the patient for the next several months.   Weight /BMI 12/13/2017 03/28/2017 12/27/2016  WEIGHT 219 lb 208 lb 212 lb  HEIGHT 5\' 5"  5\' 5"  5\' 5"   BMI 36.44 kg/m2 34.61 kg/m2 35.28 kg/m2      Vaginal discharge Excessive thick white discharge, sent for wet prep

## 2017-12-13 NOTE — Patient Instructions (Addendum)
F/u in 6 months, call if you need me before'  cologuard to be ordered ( ensure covered by [facility])  Mammogram to be scheduled at checkout  Pap and wet prep sent today I will send results to you  Please reduce fried and fatty foods  Please start walking for 30 minutes every day  Please commit to 'mindful healthy eating'  1500 cal diet sheets provided as a guideline  Aim to lose 10 pounds  Thank you  for choosing [facility]. We consider it a privelige to serve you.  Delivering excellent health care in a caring and  compassionate way is our goal.  Partnering with you,  so that together we can achieve this goal is our strategy.

## 2017-12-17 DIAGNOSIS — N898 Other specified noninflammatory disorders of vagina: Secondary | ICD-10-CM | POA: Insufficient documentation

## 2017-12-17 NOTE — Assessment & Plan Note (Signed)

## 2017-12-17 NOTE — Assessment & Plan Note (Signed)
Deteriorated. Patient re-educated about  the importance of commitment to a  minimum of 150 minutes of exercise per week.  The importance of healthy food choices with portion control discussed. Encouraged to start a food diary, count calories and to consider  joining a support group. Sample diet sheets offered. Goals set by the patient for the next several months.   Weight /BMI 12/13/2017 03/28/2017 12/27/2016  WEIGHT 219 lb 208 lb 212 lb  HEIGHT 5\' 5"  5\' 5"  5\' 5"   BMI 36.44 kg/m2 34.61 kg/m2 35.28 kg/m2

## 2017-12-17 NOTE — Assessment & Plan Note (Signed)
Excessive thick white discharge, sent for wet prep

## 2017-12-20 ENCOUNTER — Encounter: Payer: Self-pay | Admitting: Family Medicine

## 2017-12-20 ENCOUNTER — Other Ambulatory Visit: Payer: Self-pay | Admitting: Family Medicine

## 2017-12-20 LAB — CYTOLOGY - PAP
Bacterial vaginitis: POSITIVE — AB
Candida vaginitis: POSITIVE — AB
Diagnosis: NEGATIVE
HPV (WINDOPATH): NOT DETECTED
Trichomonas: NEGATIVE

## 2017-12-20 MED ORDER — METRONIDAZOLE 500 MG PO TABS: 500.0000 mg | ORAL_TABLET | Freq: Two times a day (BID) | ORAL | 0 refills | Status: DC

## 2017-12-20 MED ORDER — FLUCONAZOLE 150 MG PO TABS: ORAL_TABLET | ORAL | 0 refills | Status: DC

## 2017-12-20 NOTE — Progress Notes (Signed)
flagyll  

## 2018-01-01 ENCOUNTER — Encounter: Payer: Self-pay | Admitting: Family Medicine

## 2018-01-30 ENCOUNTER — Ambulatory Visit
Admission: RE | Admit: 2018-01-30 | Discharge: 2018-01-30 | Disposition: A | Discharge status: Home / Self Care | Payer: BC Managed Care – PPO | Source: Ambulatory Visit | Attending: Family Medicine | Admitting: Family Medicine

## 2018-01-30 DIAGNOSIS — Z1231 Encounter for screening mammogram for malignant neoplasm of breast: Secondary | ICD-10-CM

## 2018-05-16 ENCOUNTER — Other Ambulatory Visit: Payer: Self-pay | Admitting: Family Medicine

## 2018-06-14 ENCOUNTER — Other Ambulatory Visit: Payer: Self-pay

## 2018-06-14 ENCOUNTER — Ambulatory Visit: Payer: BC Managed Care – PPO | Admitting: Family Medicine

## 2018-06-26 ENCOUNTER — Encounter: Payer: Self-pay | Admitting: Family Medicine

## 2018-06-26 ENCOUNTER — Ambulatory Visit (INDEPENDENT_AMBULATORY_CARE_PROVIDER_SITE_OTHER): Payer: BC Managed Care – PPO | Admitting: Family Medicine

## 2018-06-26 VITALS — BP 118/80 | Ht 65.0 in | Wt 217.0 lb

## 2018-06-26 DIAGNOSIS — I10 Essential (primary) hypertension: Secondary | ICD-10-CM

## 2018-06-26 DIAGNOSIS — E559 Vitamin D deficiency, unspecified: Secondary | ICD-10-CM

## 2018-06-26 DIAGNOSIS — E669 Obesity, unspecified: Secondary | ICD-10-CM

## 2018-06-26 DIAGNOSIS — J3089 Other allergic rhinitis: Secondary | ICD-10-CM | POA: Diagnosis not present

## 2018-06-26 DIAGNOSIS — N926 Irregular menstruation, unspecified: Secondary | ICD-10-CM

## 2018-06-26 DIAGNOSIS — Z1211 Encounter for screening for malignant neoplasm of colon: Secondary | ICD-10-CM

## 2018-06-26 NOTE — Assessment & Plan Note (Signed)
No recent or current symptom flare, on no medication at this time

## 2018-06-26 NOTE — Assessment & Plan Note (Signed)
Discussed the need to supplement with the patient  And she is opting for daily oTC , will evaluate by lab in the Fall

## 2018-06-26 NOTE — Patient Instructions (Signed)
F/U  With MD in November, call if you need me before, shingrix # 1 at that visit  Fasting CBC, lipid, cmp and eGFr,TSH and vit D last week In October  Please start OTC vit D 1000 IU once daily  It is important that you exercise regularly at least 30 minutes 5 times a week. If you develop chest pain, have severe difficulty breathing, or feel very tired, stop exercising immediately and seek medical attention    .Think about what you will eat, plan ahead. Choose " clean, green, fresh or frozen" over canned, processed or packaged foods which are more sugary, salty and fatty. 70 to 75% of food eaten should be vegetables and fruit. Three meals at set times with snacks allowed between meals, but they must be fruit or vegetables. Aim to eat over a 12 hour period , example 7 am to 7 pm, and STOP after  your last meal of the day. Drink water,generally about 64 ounces per day, no other drink is as healthy. Fruit juice is best enjoyed in a healthy way, by EATING the fruit.   You ae referred to Dr fields for average risk screening colonoscopy  We will contact supplier and let them know cologuard test is cancelled from our end, do not do this  You are being referred to dr Garwin Brothers re  abnormal nleeding and for your annual exam  Thanks for choosing [facility], we consider it a privelige to serve you.

## 2018-06-26 NOTE — Assessment & Plan Note (Signed)
Controlled, no change in medication DASH diet and commitment to daily physical activity for a minimum of 30 minutes discussed and encouraged, as a part of hypertension management. The importance of attaining a healthy weight is also discussed.  BP/Weight 06/26/2018 12/13/2017 03/28/2017 12/27/2016 09/23/2016 09/22/2016 05/24/8117  Systolic BP 147 829 562 130 865 784 696  Diastolic BP 80 80 84 295 45 79 84  Wt. (Lbs) 217 219 208 212 - 203.25 199.12  BMI 36.11 36.44 34.61 35.28 - 33.82 33.14

## 2018-06-26 NOTE — Assessment & Plan Note (Signed)
S/P ablation, reports heavy bleeding in November, and feels as though she will start bleeding again, refer to [facility] for eval

## 2018-06-26 NOTE — Progress Notes (Signed)
Virtual Visit via Telephone Note  I connected with Alexa Savage on 06/26/18 at 11:00 AM EDT by telephone and verified that I am speaking with the correct person using two identifiers.  Location: Patient:home Provider:office   I discussed the limitations, risks, security and privacy concerns of performing an evaluation and management service by telephone and the availability of in person appointments. I also discussed with the patient that there may be a patient responsible charge related to this service. The patient expressed understanding and agreed to proceed.   History of Present Illness: F/U chronic problems. I am cramping since yesterday, though I have not bled since last November, and before that since Oct 2018, she has had ablation in the past, also reports increased hot flashes Exercise commitment needs to improve she reports and weight fluctuates, will work on both Mild allergy symptoms on no medication currently Denies recent fever or chills. Denies sinus pressure, nasal congestion, ear pain or sore throat. Denies chest congestion, productive cough or wheezing. Denies chest pains, palpitations and leg swelling Denies abdominal pain, nausea, vomiting,diarrhea or constipation.   Denies dysuria, frequency, hesitancy or incontinence. Denies joint pain, swelling and limitation in mobility. Denies headaches, seizures, numbness, or tingling. Denies depression, anxiety or insomnia. Denies skin break down or rash.   '    Observations/Objective: BP 118/80   Ht 5\' 5"  (1.651 m)   Wt 217 lb (98.4 kg)   LMP 08/31/2016 (Approximate)   BMI 36.11 kg/m  Good communication with no confusion and intact memory. Alert and oriented x 3 No signs of respiratory distress during speech    Assessment and Plan: HTN (hypertension) Controlled, no change in medication DASH diet and commitment to daily physical activity for a minimum of 30 minutes discussed and encouraged, as a part of  hypertension management. The importance of attaining a healthy weight is also discussed.  BP/Weight 06/26/2018 12/13/2017 03/28/2017 12/27/2016 09/23/2016 09/22/2016 07/22/3014  Systolic BP 010 932 355 732 202 542 706  Diastolic BP 80 80 84 237 45 79 84  Wt. (Lbs) 217 219 208 212 - 203.25 199.12  BMI 36.11 36.44 34.61 35.28 - 33.82 33.14       Perennial and seasonal allergic rhinitis No recent or current symptom flare, on no medication at this time  Vitamin D deficiency Discussed the need to supplement with the patient  And she is opting for daily oTC , will evaluate by lab in the Fall  Morbid obesity ([place]) Unchanged, obesity associated with hypertension  Patient re-educated about  the importance of commitment to a  minimum of 150 minutes of exercise per week as able.  The importance of healthy food choices with portion control discussed, as well as eating regularly and within a 12 hour window most days. The need to choose "clean , green" food 50 to 75% of the time is discussed, as well as to make water the primary drink and set a goal of 64 ounces water daily.   Weight /BMI 06/26/2018 12/13/2017 03/28/2017  WEIGHT 217 lb 219 lb 208 lb  HEIGHT 5\' 5"  5\' 5"  5\' 5"   BMI 36.11 kg/m2 36.44 kg/m2 34.61 kg/m2      Irregular menstrual bleeding S/P ablation, reports heavy bleeding in November, and feels as though she will start bleeding again, refer to [facility] for eval    Follow Up Instructions:    I discussed the assessment and treatment plan with the patient. The patient was provided an opportunity to ask questions and all were  answered. The patient agreed with the plan and demonstrated an understanding of the instructions.   The patient was advised to call back or seek an in-person evaluation if the symptoms worsen or if the condition fails to improve as anticipated.  I provided 25 minutes of non-face-to-face time during this encounter.   Tula Nakayama, MD

## 2018-06-26 NOTE — Assessment & Plan Note (Signed)
Unchanged, obesity associated with hypertension  Patient re-educated about  the importance of commitment to a  minimum of 150 minutes of exercise per week as able.  The importance of healthy food choices with portion control discussed, as well as eating regularly and within a 12 hour window most days. The need to choose "clean , green" food 50 to 75% of the time is discussed, as well as to make water the primary drink and set a goal of 64 ounces water daily.   Weight /BMI 06/26/2018 12/13/2017 03/28/2017  WEIGHT 217 lb 219 lb 208 lb  HEIGHT 5\' 5"  5\' 5"  5\' 5"   BMI 36.11 kg/m2 36.44 kg/m2 34.61 kg/m2

## 2018-07-03 ENCOUNTER — Other Ambulatory Visit: Payer: Self-pay

## 2018-07-04 ENCOUNTER — Encounter: Payer: Self-pay | Admitting: Gastroenterology

## 2018-07-17 ENCOUNTER — Encounter: Payer: Self-pay | Admitting: Gastroenterology

## 2018-08-09 ENCOUNTER — Ambulatory Visit: Payer: BC Managed Care – PPO

## 2018-11-09 ENCOUNTER — Ambulatory Visit (INDEPENDENT_AMBULATORY_CARE_PROVIDER_SITE_OTHER): Payer: BC Managed Care – PPO | Admitting: *Deleted

## 2018-11-09 ENCOUNTER — Other Ambulatory Visit: Payer: Self-pay

## 2018-11-09 DIAGNOSIS — Z1211 Encounter for screening for malignant neoplasm of colon: Secondary | ICD-10-CM

## 2018-11-09 MED ORDER — PEG 3350-KCL-NA BICARB-NACL 420 G PO SOLR: 4000.0000 mL | Freq: Once | ORAL | 0 refills | Status: AC

## 2018-11-09 NOTE — Addendum Note (Signed)
Addended by: Metro Kung on: 11/09/2018 01:56 PM   Modules accepted: Orders, SmartSet

## 2018-11-09 NOTE — Progress Notes (Signed)
Ok to schedule.

## 2018-11-09 NOTE — Progress Notes (Signed)
Gastroenterology Pre-Procedure Review  Request Date: 11/09/2018 Requesting Physician: Dr. Moshe Cipro, no previous TCS  PATIENT REVIEW QUESTIONS: The patient responded to the following health history questions as indicated:    1. Diabetes Melitis: no 2. Joint replacements in the past 12 months: no 3. Major health problems in the past 3 months: no 4. Has an artificial valve or MVP: no 5. Has a defibrillator: no 6. Has been advised in past to take antibiotics in advance of a procedure like teeth cleaning: no 7. Family history of colon cancer: no  8. Alcohol Use: no 9. Illicit drug Use: no 10. History of sleep apnea: no  11. History of coronary artery or other vascular stents placed within the last 12 months: no 12. History of any prior anesthesia complications: no 13. There is no height or weight on file to calculate BMI. ht: 5'5 wt: 223lbs    MEDICATIONS & ALLERGIES:    Patient reports the following regarding taking any blood thinners:   Plavix? no Aspirin? no Coumadin? no Brilinta? no Xarelto? no Eliquis? no Pradaxa? no Savaysa? no Effient? no  Patient confirms/reports the following medications:  Current Outpatient Medications  Medication Sig Dispense Refill  . BIOTIN PO Take 1 tablet by mouth daily.    . Cholecalciferol (VITAMIN D3 PO) Take by mouth daily.    . Multiple Vitamins-Minerals (MULTIVITAMIN WITH MINERALS) tablet Take 1 tablet by mouth daily.    Marland Kitchen triamterene-hydrochlorothiazide (MAXZIDE-25) 37.5-25 MG tablet TAKE ONE AND A HALF TABLETS BY MOUTH ONCE DAILY 135 tablet 3  . vitamin A 8000 UNIT capsule Take 8,000 Units by mouth daily.     No current facility-administered medications for this visit.     Patient confirms/reports the following allergies:  Allergies  Allergen Reactions  . Metronidazole Swelling  . Penicillins Hives and Rash    Has patient had a PCN reaction causing immediate rash, facial/tongue/throat swelling, SOB or lightheadedness with hypotension:  Yes Has patient had a PCN reaction causing severe rash involving mucus membranes or skin necrosis: Yes Has patient had a PCN reaction that required hospitalization: No Has patient had a PCN reaction occurring within the last 10 years: No If all of the above answers are "NO", then may proceed with Cephalosporin use.     No orders of the defined types were placed in this encounter.   AUTHORIZATION INFORMATION Primary Insurance: [place],  [place] #: [id number],  Group [id number] Pre-Cert / Auth required: No, not required  SCHEDULE INFORMATION: Procedure has been scheduled as follows:  Date: 01/26/2019, Time: 1:00 Location: [facility] with Dr. Oneida Alar  This Gastroenterology Pre-Precedure Review Form is being routed to the following provider(s): Walden Field, NP

## 2018-11-09 NOTE — Patient Instructions (Addendum)
Alexa Savage   11-Jun-1970 MRN: [id number]    Procedure Date: 05/24/2019 Time to register: 8:30 am Place to register: [facility] Procedure Time: 9:30 am Scheduled provider: Dr. Oneida Alar  PREPARATION FOR COLONOSCOPY WITH TRI-LYTE SPLIT PREP  Please notify us immediately if you are diabetic, take iron supplements, or if you are on Coumadin or any other blood thinners.   You will need to purchase 1 fleet enema and 1 box of Bisacodyl 45m tablets.   1 DAY BEFORE PROCEDURE:  DATE: 05/23/2019   DAY: Wednesday Continue clear liquids the entire day - NO SOLID FOOD.    At 2:00 pm:  Take 2 Bisacodyl tablets.   At 4:00pm:  Start drinking your solution. Make sure you mix well per instructions on the bottle. Try to drink 1 (one) 8 ounce glass every 10-15 minutes until you have consumed HALF the jug. You should complete by 6:00pm.You must keep the left over solution refrigerated until completed next day.  Continue clear liquids. You must drink plenty of clear liquids to prevent dehyration and kidney failure.     DAY OF PROCEDURE:   DATE: 05/24/2019   DAY: Thursday If you take medications for your heart, blood pressure or breathing, you may take these medications.    Five hours before your procedure time @ 4:30 am:  Finish remaining amout of bowel prep, drinking 1 (one) 8 ounce glass every 10-15 minutes until complete. You have two hours to consume remaining prep.   Three hours before your procedure time @ 6:30 am:  Nothing by mouth.   At least one hour before going to the hospital:  Give yourself one Fleet enema. You may take your morning medications with sip of water unless we have instructed otherwise.      Please see below for Dietary Information.  CLEAR LIQUIDS INCLUDE:  Water Jello (NOT red in color)   Ice Popsicles (NOT red in color)   Tea (sugar ok, no milk/cream) Powdered fruit flavored drinks  Coffee (sugar ok, no milk/cream) Gatorade/ Lemonade/ Kool-Aid  (NOT red in  color)   Juice: apple, white grape, white cranberry Soft drinks  Clear bullion, consomme, broth (fat free beef/chicken/vegetable)  Carbonated beverages (any kind)  Strained chicken noodle soup Hard Candy   Remember: Clear liquids are liquids that will allow you to see your fingers on the other side of a clear glass. Be sure liquids are NOT red in color, and not cloudy, but CLEAR.  DO NOT EAT OR DRINK ANY OF THE FOLLOWING:  Dairy products of any kind   Cranberry juice Tomato juice / V8 juice   Grapefruit juice Orange juice     Red grape juice  Do not eat any solid foods, including such foods as: cereal, oatmeal, yogurt, fruits, vegetables, creamed soups, eggs, bread, crackers, pureed foods in a blender, etc.   HELPFUL HINTS FOR DRINKING PREP SOLUTION:   Make sure prep is extremely cold. Mix and refrigerate the the morning of the prep. You may also put in the freezer.   You may try mixing some Crystal Light or Country Time Lemonade if you prefer. Mix in small amounts; add more if necessary.  Try drinking through a straw  Rinse mouth with water or a mouthwash between glasses, to remove after-taste.  Try sipping on a cold beverage /ice/ popsicles between glasses of prep.  Place a piece of sugar-free hard candy in mouth between glasses.  If you become nauseated, try consuming smaller amounts, or stretch  out the time between glasses. Stop for 30-60 minutes, then slowly start back drinking.        OTHER INSTRUCTIONS  You will need a responsible adult at least 50 years of age to accompany you and drive you home. This person must remain in the waiting room during your procedure. The hospital will cancel your procedure if you do not have a responsible adult with you.   1. Wear loose fitting clothing that is easily removed. 2. Leave jewelry and other valuables at home.  3. Remove all body piercing jewelry and leave at home. 4. Total time from sign-in until discharge is approximately  2-3 hours. 5. You should go home directly after your procedure and rest. You can resume normal activities the day after your procedure. 6. The day of your procedure you should not:  Drive  Make legal decisions  Operate machinery  Drink alcohol  Return to work   You may call the office (Dept: [phone]) before 5:00pm, or page the doctor on call [phone]) after 5:00pm, for further instructions, if necessary.   Insurance Information YOU WILL NEED TO CHECK WITH YOUR INSURANCE COMPANY FOR THE BENEFITS OF COVERAGE YOU HAVE FOR THIS PROCEDURE.  UNFORTUNATELY, NOT ALL INSURANCE COMPANIES HAVE BENEFITS TO COVER ALL OR PART OF THESE TYPES OF PROCEDURES.  IT IS YOUR RESPONSIBILITY TO CHECK YOUR BENEFITS, HOWEVER, WE WILL BE GLAD TO ASSIST YOU WITH ANY CODES YOUR INSURANCE COMPANY MAY NEED.    PLEASE NOTE THAT MOST INSURANCE COMPANIES WILL NOT COVER A SCREENING COLONOSCOPY FOR PEOPLE UNDER THE AGE OF 50  IF YOU HAVE BCBS INSURANCE, YOU MAY HAVE BENEFITS FOR A SCREENING COLONOSCOPY BUT IF POLYPS ARE FOUND THE DIAGNOSIS WILL CHANGE AND THEN YOU MAY HAVE A DEDUCTIBLE THAT WILL NEED TO BE MET. SO PLEASE MAKE SURE YOU CHECK YOUR BENEFITS FOR A SCREENING COLONOSCOPY AS WELL AS A DIAGNOSTIC COLONOSCOPY.

## 2018-11-28 ENCOUNTER — Ambulatory Visit (INDEPENDENT_AMBULATORY_CARE_PROVIDER_SITE_OTHER): Payer: BC Managed Care – PPO

## 2018-11-28 ENCOUNTER — Encounter: Payer: Self-pay | Admitting: Family Medicine

## 2018-11-28 ENCOUNTER — Other Ambulatory Visit: Payer: Self-pay

## 2018-11-28 DIAGNOSIS — Z23 Encounter for immunization: Secondary | ICD-10-CM

## 2018-12-26 ENCOUNTER — Other Ambulatory Visit: Payer: Self-pay

## 2018-12-26 ENCOUNTER — Encounter: Payer: Self-pay | Admitting: Family Medicine

## 2018-12-26 ENCOUNTER — Ambulatory Visit: Payer: BC Managed Care – PPO | Admitting: Family Medicine

## 2018-12-26 VITALS — BP 122/86 | HR 74 | Temp 97.8°F | Resp 15 | Ht 65.0 in | Wt 223.0 lb

## 2018-12-26 DIAGNOSIS — K644 Residual hemorrhoidal skin tags: Secondary | ICD-10-CM

## 2018-12-26 DIAGNOSIS — E559 Vitamin D deficiency, unspecified: Secondary | ICD-10-CM | POA: Diagnosis not present

## 2018-12-26 DIAGNOSIS — R7301 Impaired fasting glucose: Secondary | ICD-10-CM

## 2018-12-26 DIAGNOSIS — Z1231 Encounter for screening mammogram for malignant neoplasm of breast: Secondary | ICD-10-CM | POA: Diagnosis not present

## 2018-12-26 DIAGNOSIS — E669 Obesity, unspecified: Secondary | ICD-10-CM

## 2018-12-26 DIAGNOSIS — I10 Essential (primary) hypertension: Secondary | ICD-10-CM

## 2018-12-26 DIAGNOSIS — Z13220 Encounter for screening for lipoid disorders: Secondary | ICD-10-CM

## 2018-12-26 MED ORDER — PHENTERMINE HCL 37.5 MG PO TABS: ORAL_TABLET | ORAL | 3 refills | Status: DC

## 2018-12-26 NOTE — Assessment & Plan Note (Signed)
Controlled, no change in medication DASH diet and commitment to daily physical activity for a minimum of 30 minutes discussed and encouraged, as a part of hypertension management. The importance of attaining a healthy weight is also discussed.  BP/Weight 12/26/2018 06/26/2018 12/13/2017 03/28/2017 12/27/2016 0000000 [id number]  Systolic BP 123XX123 123456 123456 123456 0000000 Q000111Q [id number]  Diastolic BP 86 80 80 84 123XX123 45 79  Wt. (Lbs) 223 217 219 208 212 - 203.25  BMI 37.11 36.11 36.44 34.61 35.28 - 33.82

## 2018-12-26 NOTE — Assessment & Plan Note (Signed)
  Patient re-educated about  the importance of commitment to a  minimum of 150 minutes of exercise per week as able.  The importance of healthy food choices with portion control discussed, as well as eating regularly and within a 12 hour window most days. The need to choose "clean , green" food 50 to 75% of the time is discussed, as well as to make water the primary drink and set a goal of 64 ounces water daily.    Weight /BMI 12/26/2018 06/26/2018 12/13/2017  WEIGHT 223 lb 217 lb 219 lb  HEIGHT 5\' 5"  5\' 5"  5\' 5"   BMI 37.11 kg/m2 36.11 kg/m2 36.44 kg/m2    Start half phentermine daily

## 2018-12-26 NOTE — Assessment & Plan Note (Signed)
Takes OTC supplement  Updated lab needed at/ before next visit.

## 2018-12-26 NOTE — Progress Notes (Signed)
   Alexa Savage     MRN: [id number]      DOB: January 24, 1971   HPI Ms. Thang is here for follow up and re-evaluation of chronic medical conditions, medication management and review of any available recent lab and radiology data.  Preventive health is updated, specifically  Cancer screening and Immunization.   C/o uncontrolled hot flashes, started OTC med in past 3 weeks, no specific improvement noted Denies adverse reactions to current medications since the last visit.  Inconsistent to very little exercise , weakness is with sodas and sweats, interested in appetite suppressant and will start walkig at work has a potential partner  ROS Denies recent fever or chills. Denies sinus pressure, nasal congestion, ear pain or sore throat. Denies chest congestion, productive cough or wheezing. Denies chest pains, palpitations and leg swelling Denies abdominal pain, nausea, vomiting,diarrhea or constipation.   Denies dysuria, frequency, hesitancy or incontinence. Denies joint pain, swelling and limitation in mobility. Denies headaches, seizures, numbness Denies depression or anxiety, she does have insomnia , a combination of poor sleep hygiene and hot flashes Denies skin break down or rash.   PE  BP 122/86   Pulse 74   Temp 97.8 F (36.6 C) (Temporal)   Resp 15   Ht 5\' 5"  (1.651 m)   Wt 223 lb (101.2 kg)   SpO2 97%   BMI 37.11 kg/m   Patient alert and oriented and in no cardiopulmonary distress.  HEENT: No facial asymmetry, EOMI,     Neck supple .  Chest: Clear to auscultation bilaterally.  CVS: S1, S2 no murmurs, no S3.Regular rate.  ABD: Soft non tender.   Ext: No edema  MS: Adequate ROM spine, shoulders, hips and knees.  Skin: Intact, no ulcerations or rash noted.  Psych: Good eye contact, normal affect. Memory intact not anxious or depressed appearing.  CNS: CN 2-12 intact, power,  normal throughout.no focal deficits noted.   Assessment & Plan  HTN  (hypertension) Controlled, no change in medication DASH diet and commitment to daily physical activity for a minimum of 30 minutes discussed and encouraged, as a part of hypertension management. The importance of attaining a healthy weight is also discussed.  BP/Weight 12/26/2018 06/26/2018 12/13/2017 03/28/2017 12/27/2016 0000000 [id number]  Systolic BP 123XX123 123456 123456 123456 0000000 Q000111Q 123XX123  Diastolic BP 86 80 80 84 123XX123 45 79  Wt. (Lbs) 223 217 219 208 212 - 203.25  BMI 37.11 36.11 36.44 34.61 35.28 - 33.82       Obesity, Class II, BMI 35-39.9  Patient re-educated about  the importance of commitment to a  minimum of 150 minutes of exercise per week as able.  The importance of healthy food choices with portion control discussed, as well as eating regularly and within a 12 hour window most days. The need to choose "clean , green" food 50 to 75% of the time is discussed, as well as to make water the primary drink and set a goal of 64 ounces water daily.    Weight /BMI 12/26/2018 06/26/2018 12/13/2017  WEIGHT 223 lb 217 lb 219 lb  HEIGHT 5\' 5"  5\' 5"  5\' 5"   BMI 37.11 kg/m2 36.11 kg/m2 36.44 kg/m2    Start half phentermine daily  Vitamin D deficiency Takes OTC supplement  Updated lab needed at/ before next visit.

## 2018-12-26 NOTE — Patient Instructions (Signed)
F/U in 14 weeks, call if you need me before,in office   Please schedule mammogram  Nurse visit for shingrix #1 mid December   New to help with weight loss is HALF phentermine every morning  Please plan food that you wil;l eat, and remember  What you are working on to reduce   It is important that you exercise regularly at least 30 minutes 5 times a week. If you develop chest pain, have severe difficulty breathing, or feel very tired, stop exercising immediately and seek medical attention    Fasting CBC, lipid, cmp and EGFr, HBA1C TSH and vit D first week in December  Thanks for choosing [facility], we consider it a privelige to serve you.

## 2019-01-15 NOTE — Progress Notes (Signed)
Called pt and informed her that we needed to reschedule her procedure due to a death in SLF's family.  Pt rescheduled to 05/24/2019.  Pt is aware that I will mail her out new prep instructions.  Endo notified of new procedure date.

## 2019-01-24 ENCOUNTER — Other Ambulatory Visit: Payer: Self-pay

## 2019-01-24 ENCOUNTER — Ambulatory Visit (INDEPENDENT_AMBULATORY_CARE_PROVIDER_SITE_OTHER): Payer: BC Managed Care – PPO

## 2019-01-24 ENCOUNTER — Other Ambulatory Visit (HOSPITAL_COMMUNITY): Payer: BC Managed Care – PPO

## 2019-01-24 DIAGNOSIS — Z23 Encounter for immunization: Secondary | ICD-10-CM | POA: Diagnosis not present

## 2019-01-24 NOTE — Progress Notes (Signed)
1st shingrix given without complication. Next due in 2-6 months

## 2019-02-19 ENCOUNTER — Other Ambulatory Visit: Payer: Self-pay

## 2019-02-19 ENCOUNTER — Ambulatory Visit
Admission: RE | Admit: 2019-02-19 | Discharge: 2019-02-19 | Disposition: A | Discharge status: Home / Self Care | Payer: BC Managed Care – PPO | Source: Ambulatory Visit | Attending: Family Medicine | Admitting: Family Medicine

## 2019-02-19 DIAGNOSIS — Z1231 Encounter for screening mammogram for malignant neoplasm of breast: Secondary | ICD-10-CM

## 2019-02-20 ENCOUNTER — Other Ambulatory Visit: Payer: Self-pay | Admitting: Family Medicine

## 2019-02-20 DIAGNOSIS — R928 Other abnormal and inconclusive findings on diagnostic imaging of breast: Secondary | ICD-10-CM

## 2019-02-22 ENCOUNTER — Ambulatory Visit
Admission: RE | Admit: 2019-02-22 | Discharge: 2019-02-22 | Disposition: A | Discharge status: Home / Self Care | Payer: BC Managed Care – PPO | Source: Ambulatory Visit | Attending: Family Medicine | Admitting: Family Medicine

## 2019-02-22 ENCOUNTER — Other Ambulatory Visit: Payer: Self-pay

## 2019-02-22 ENCOUNTER — Other Ambulatory Visit: Payer: Self-pay | Admitting: Family Medicine

## 2019-02-22 DIAGNOSIS — R928 Other abnormal and inconclusive findings on diagnostic imaging of breast: Secondary | ICD-10-CM

## 2019-02-22 DIAGNOSIS — R921 Mammographic calcification found on diagnostic imaging of breast: Secondary | ICD-10-CM

## 2019-02-26 ENCOUNTER — Ambulatory Visit
Admission: RE | Admit: 2019-02-26 | Discharge: 2019-02-26 | Disposition: A | Discharge status: Home / Self Care | Payer: BC Managed Care – PPO | Source: Ambulatory Visit | Attending: Family Medicine | Admitting: Family Medicine

## 2019-02-26 ENCOUNTER — Other Ambulatory Visit: Payer: Self-pay

## 2019-02-26 DIAGNOSIS — R921 Mammographic calcification found on diagnostic imaging of breast: Secondary | ICD-10-CM

## 2019-03-02 ENCOUNTER — Encounter: Payer: Self-pay | Admitting: Family Medicine

## 2019-03-15 ENCOUNTER — Ambulatory Visit (INDEPENDENT_AMBULATORY_CARE_PROVIDER_SITE_OTHER): Payer: BC Managed Care – PPO | Admitting: Family Medicine

## 2019-03-29 ENCOUNTER — Ambulatory Visit (INDEPENDENT_AMBULATORY_CARE_PROVIDER_SITE_OTHER): Payer: BC Managed Care – PPO | Admitting: Family Medicine

## 2019-03-31 LAB — HEMOGLOBIN A1C
Hgb A1c MFr Bld: 5.5 % of total Hgb (ref <5.7)
Mean Plasma Glucose: 111 (calc)
eAG (mmol/L): 6.2 (calc)

## 2019-03-31 LAB — CBC
HCT: 41.5 % (ref 35.0–45.0)
Hemoglobin: 13.8 g/dL (ref 11.7–15.5)
MCH: 29.2 pg (ref 27.0–33.0)
MCHC: 33.3 g/dL (ref 32.0–36.0)
MCV: 87.7 fL (ref 80.0–100.0)
MPV: 10.6 fL (ref 7.5–12.5)
Platelets: 254 10*3/uL (ref 140–400)
RBC: 4.73 10*6/uL (ref 3.80–5.10)
RDW: 12.4 % (ref 11.0–15.0)
WBC: 3.9 10*3/uL (ref 3.8–10.8)

## 2019-03-31 LAB — COMPLETE METABOLIC PANEL WITH GFR
AG Ratio: 1.6 (calc) (ref 1.0–2.5)
ALT: 13 U/L (ref 6–29)
AST: 13 U/L (ref 10–35)
Albumin: 4.6 g/dL (ref 3.6–5.1)
Alkaline phosphatase (APISO): 79 U/L (ref 37–153)
BUN: 11 mg/dL (ref 7–25)
CO2: 27 mmol/L (ref 20–32)
Calcium: 9.8 mg/dL (ref 8.6–10.4)
Chloride: 105 mmol/L (ref 98–110)
Creat: 0.77 mg/dL (ref 0.50–1.05)
GFR, Est African American: 104 mL/min/{1.73_m2} (ref >=60)
GFR, Est Non African American: 90 mL/min/{1.73_m2} (ref >=60)
Globulin: 2.8 g/dL (calc) (ref 1.9–3.7)
Glucose, Bld: 88 mg/dL (ref 65–99)
Potassium: 4.1 mmol/L (ref 3.5–5.3)
Sodium: 140 mmol/L (ref 135–146)
Total Bilirubin: 0.6 mg/dL (ref 0.2–1.2)
Total Protein: 7.4 g/dL (ref 6.1–8.1)

## 2019-03-31 LAB — VITAMIN D 25 HYDROXY (VIT D DEFICIENCY, FRACTURES): Vit D, 25-Hydroxy: 13 ng/mL — ABNORMAL LOW (ref 30–100)

## 2019-03-31 LAB — LIPID PANEL
Cholesterol: 183 mg/dL (ref <200)
HDL: 65 mg/dL (ref >=50)
LDL Cholesterol (Calc): 103 mg/dL (calc) — ABNORMAL HIGH
Non-HDL Cholesterol (Calc): 118 mg/dL (calc) (ref <130)
Total CHOL/HDL Ratio: 2.8 (calc) (ref <5.0)
Triglycerides: 61 mg/dL (ref <150)

## 2019-03-31 LAB — TSH: TSH: 1.22 mIU/L

## 2019-04-02 ENCOUNTER — Encounter: Payer: Self-pay | Admitting: Family Medicine

## 2019-04-03 ENCOUNTER — Ambulatory Visit: Payer: BC Managed Care – PPO | Admitting: Family Medicine

## 2019-04-09 ENCOUNTER — Other Ambulatory Visit: Payer: Self-pay | Admitting: Family Medicine

## 2019-04-09 MED ORDER — ERGOCALCIFEROL 1.25 MG (50000 UT) PO CAPS: 50000.0000 [IU] | ORAL_CAPSULE | ORAL | 1 refills | Status: DC

## 2019-04-20 ENCOUNTER — Ambulatory Visit: Payer: BC Managed Care – PPO | Admitting: Family Medicine

## 2019-04-20 ENCOUNTER — Other Ambulatory Visit: Payer: Self-pay

## 2019-04-20 ENCOUNTER — Encounter: Payer: Self-pay | Admitting: Family Medicine

## 2019-04-20 ENCOUNTER — Telehealth: Payer: Self-pay | Admitting: Family Medicine

## 2019-04-20 VITALS — BP 124/82 | HR 84 | Temp 98.0°F | Ht 65.0 in | Wt 208.0 lb

## 2019-04-20 DIAGNOSIS — E559 Vitamin D deficiency, unspecified: Secondary | ICD-10-CM | POA: Diagnosis not present

## 2019-04-20 DIAGNOSIS — E669 Obesity, unspecified: Secondary | ICD-10-CM

## 2019-04-20 DIAGNOSIS — I10 Essential (primary) hypertension: Secondary | ICD-10-CM | POA: Diagnosis not present

## 2019-04-20 DIAGNOSIS — R7303 Prediabetes: Secondary | ICD-10-CM

## 2019-04-20 MED ORDER — PHENTERMINE HCL 37.5 MG PO TABS: ORAL_TABLET | ORAL | 1 refills | Status: DC

## 2019-04-20 NOTE — Telephone Encounter (Signed)
please schedule shingrix #2  The first / 2nfd week in May for patient and let her know. Nurse visit only for vaccine. thanks

## 2019-04-20 NOTE — Assessment & Plan Note (Signed)
Controlled, no change in medication DASH diet and commitment to daily physical activity for a minimum of 30 minutes discussed and encouraged, as a part of hypertension management. The importance of attaining a healthy weight is also discussed.  BP/Weight 04/20/2019 12/26/2018 06/26/2018 12/13/2017 03/28/2017 [id number]  Systolic BP A999333 123XX123 123456 123456 123456 0000000 Q000111Q  Diastolic BP 82 86 80 80 84 100 45  Wt. (Lbs) 218.12 223 217 219 208 212 -  BMI 36.3 37.11 36.11 36.44 34.61 35.28 -

## 2019-04-20 NOTE — Assessment & Plan Note (Signed)
  Patient re-educated about  the importance of commitment to a  minimum of 150 minutes of exercise per week as able.  The importance of healthy food choices with portion control discussed, as well as eating regularly and within a 12 hour window most days. The need to choose "clean , green" food 50 to 75% of the time is discussed, as well as to make water the primary drink and set a goal of 64 ounces water daily.    Weight /BMI 04/20/2019 12/26/2018 06/26/2018  WEIGHT 218 lb 1.9 oz 223 lb 217 lb  HEIGHT 5\' 5"  5\' 5"  5\' 5"   BMI 36.3 kg/m2 37.11 kg/m2 36.11 kg/m2    Continue phentermine at current dose. Weight loss goal of 8 to 10 pounds in 4 months

## 2019-04-20 NOTE — Assessment & Plan Note (Signed)
Commit to 9 months prescription vit D then OTC daily vit D

## 2019-04-20 NOTE — Patient Instructions (Signed)
F/u in office re evaluate weight and blood pressure in 4 months, call if you need me sooner  CONGRATS on excellent labs and weight loss, keep it up  Goal weight loss of 2 to 2.5 pounds each month will get you 8 to 10 pounds down in the next 4 months.  You CAN do this!  Please take once weekly vit D for 9 months, then change to oTC vitamin d 3 , 2000 iU daily  Thanks for choosing [facility], we consider it a privelige to serve you.  Nurse to recode for TSH for coverage  Think about what you will eat, plan ahead. Choose " clean, green, fresh or frozen" over canned, processed or packaged foods which are more sugary, salty and fatty. 70 to 75% of food eaten should be vegetables and fruit. Three meals at set times with snacks allowed between meals, but they must be fruit or vegetables. Aim to eat over a 12 hour period , example 7 am to 7 pm, and STOP after  your last meal of the day. Drink water,generally about 64 ounces per day, no other drink is as healthy. Fruit juice is best enjoyed in a healthy way, by EATING the fruit. It is important that you exercise regularly at least 30 minutes 5 times a week. If you develop chest pain, have severe difficulty breathing, or feel very tired, stop exercising immediately and seek medical attention

## 2019-04-20 NOTE — Progress Notes (Signed)
Alexa Savage     MRN: [id number]      DOB: 11/25/1970   HPI Ms. Rantanen is here for follow up and re-evaluation of chronic medical conditions, medication management and review of any available recent lab and radiology data.  Preventive health is updated, specifically  Cancer screening and Immunization.   Questions or concerns regarding consultations or procedures which the PT has had in the interim are  addressed. The PT denies any adverse reactions to current medications since the last visit. Good response to phentermine and no adverse s/e There are no new concerns.  There are no specific complaints   ROS Denies recent fever or chills. Denies sinus pressure, nasal congestion, ear pain or sore throat. Denies chest congestion, productive cough or wheezing. Denies chest pains, palpitations and leg swelling Denies abdominal pain, nausea, vomiting,diarrhea or constipation.   Denies dysuria, frequency, hesitancy or incontinence. Denies joint pain, swelling and limitation in mobility. Denies headaches, seizures, numbness, or tingling. Denies depression, anxiety or insomnia. Denies skin break down or rash.   PE  BP 124/82   Pulse 84   Temp 98 F (36.7 C) (Temporal)   Ht 5\' 5"  (1.651 m)   Wt 218 lb 1.9 oz (98.9 kg)   SpO2 99%   BMI 36.30 kg/m   Patient alert and oriented and in no cardiopulmonary distress.  HEENT: No facial asymmetry, EOMI,     Neck supple .  Chest: Clear to auscultation bilaterally.  CVS: S1, S2 no murmurs, no S3.Regular rate.  ABD: Soft non tender.   Ext: No edema  MS: Adequate ROM spine, shoulders, hips and knees.  Skin: Intact, no ulcerations or rash noted.  Psych: Good eye contact, normal affect. Memory intact not anxious or depressed appearing.  CNS: CN 2-12 intact, power,  normal throughout.no focal deficits noted.   Assessment & Plan  HTN (hypertension) Controlled, no change in medication DASH diet and commitment to daily  physical activity for a minimum of 30 minutes discussed and encouraged, as a part of hypertension management. The importance of attaining a healthy weight is also discussed.  BP/Weight 04/20/2019 12/26/2018 06/26/2018 12/13/2017 03/28/2017 [id number]  Systolic BP A999333 123XX123 123456 123456 123456 0000000 Q000111Q  Diastolic BP 82 86 80 80 84 100 45  Wt. (Lbs) 218.12 223 217 219 208 212 -  BMI 36.3 37.11 36.11 36.44 34.61 35.28 -       Obesity, Class II, BMI 35-39.9  Patient re-educated about  the importance of commitment to a  minimum of 150 minutes of exercise per week as able.  The importance of healthy food choices with portion control discussed, as well as eating regularly and within a 12 hour window most days. The need to choose "clean , green" food 50 to 75% of the time is discussed, as well as to make water the primary drink and set a goal of 64 ounces water daily.    Weight /BMI 04/20/2019 12/26/2018 06/26/2018  WEIGHT 218 lb 1.9 oz 223 lb 217 lb  HEIGHT 5\' 5"  5\' 5"  5\' 5"   BMI 36.3 kg/m2 37.11 kg/m2 36.11 kg/m2    Continue phentermine at current dose. Weight loss goal of 8 to 10 pounds in 4 months  Vitamin D deficiency Commit to 9 months prescription vit D then OTC daily vit D  Prediabetes Patient educated about the importance of limiting  Carbohydrate intake , the need to commit to daily physical activity for a minimum of 30 minutes , and to  commit weight loss. The fact that changes in all these areas will reduce or eliminate all together the development of diabetes is stressed.  Corrected with dietary means only, which is great!  Diabetic Labs Latest Ref Rng & Units 03/30/2019 12/09/2017 03/24/2017 11/13/2016 09/22/2016  HbA1c <5.7 % of total Hgb 5.5 5.6 - 5.1 -  Chol <200 mg/dL 183 182 - 180 -  HDL > OR = 50 mg/dL 65 66 - 75 -  Calc LDL mg/dL (calc) 103(H) 101(H) - 91 -  Triglycerides <150 mg/dL 61 65 - 49 -  Creatinine 0.50 - 1.05 mg/dL 0.77 0.79 0.86 - 1.03(H)   BP/Weight 04/20/2019  12/26/2018 06/26/2018 12/13/2017 03/28/2017 [id number]  Systolic BP A999333 123XX123 123456 123456 123456 0000000 Q000111Q  Diastolic BP 82 86 80 80 84 100 45  Wt. (Lbs) 218.12 223 217 219 208 212 -  BMI 36.3 37.11 36.11 36.44 34.61 35.28 -   No flowsheet data found.

## 2019-04-20 NOTE — Assessment & Plan Note (Signed)
Patient educated about the importance of limiting  Carbohydrate intake , the need to commit to daily physical activity for a minimum of 30 minutes , and to commit weight loss. The fact that changes in all these areas will reduce or eliminate all together the development of diabetes is stressed.  Corrected with dietary means only, which is great!  Diabetic Labs Latest Ref Rng & Units 03/30/2019 12/09/2017 03/24/2017 11/13/2016 09/22/2016  HbA1c <5.7 % of total Hgb 5.5 5.6 - 5.1 -  Chol <200 mg/dL 183 182 - 180 -  HDL > OR = 50 mg/dL 65 66 - 75 -  Calc LDL mg/dL (calc) 103(H) 101(H) - 91 -  Triglycerides <150 mg/dL 61 65 - 49 -  Creatinine 0.50 - 1.05 mg/dL 0.77 0.79 0.86 - 1.03(H)   BP/Weight 04/20/2019 12/26/2018 06/26/2018 12/13/2017 03/28/2017 [id number]  Systolic BP A999333 123XX123 123456 123456 123456 0000000 Q000111Q  Diastolic BP 82 86 80 80 84 100 45  Wt. (Lbs) 218.12 223 217 219 208 212 -  BMI 36.3 37.11 36.11 36.44 34.61 35.28 -   No flowsheet data found.

## 2019-04-23 ENCOUNTER — Ambulatory Visit: Payer: BC Managed Care – PPO | Admitting: Family Medicine

## 2019-04-25 ENCOUNTER — Encounter: Payer: Self-pay | Admitting: Family Medicine

## 2019-05-02 NOTE — Telephone Encounter (Signed)
Spoke with PT and made appt for Alexa Savage

## 2019-05-22 ENCOUNTER — Other Ambulatory Visit (HOSPITAL_COMMUNITY)
Admission: RE | Admit: 2019-05-22 | Discharge: 2019-05-22 | Disposition: A | Discharge status: Home / Self Care | Payer: BC Managed Care – PPO | Source: Ambulatory Visit | Attending: Gastroenterology | Admitting: Gastroenterology

## 2019-05-22 ENCOUNTER — Other Ambulatory Visit (HOSPITAL_COMMUNITY): Payer: BC Managed Care – PPO

## 2019-05-22 ENCOUNTER — Other Ambulatory Visit: Payer: Self-pay

## 2019-05-22 DIAGNOSIS — Z01812 Encounter for preprocedural laboratory examination: Secondary | ICD-10-CM | POA: Diagnosis present

## 2019-05-22 DIAGNOSIS — Z20822 Contact with and (suspected) exposure to covid-19: Secondary | ICD-10-CM | POA: Insufficient documentation

## 2019-05-23 LAB — SARS CORONAVIRUS 2 (TAT 6-24 HRS): SARS Coronavirus 2: NEGATIVE

## 2019-05-24 ENCOUNTER — Ambulatory Visit (HOSPITAL_COMMUNITY)
Admission: RE | Admit: 2019-05-24 | Discharge: 2019-05-24 | Disposition: A | Discharge status: Home / Self Care | Payer: BC Managed Care – PPO | Attending: Gastroenterology | Admitting: Gastroenterology

## 2019-05-24 ENCOUNTER — Encounter (HOSPITAL_COMMUNITY)
Admission: RE | Disposition: A | Discharge status: Home / Self Care | Payer: Self-pay | Source: Home / Self Care | Attending: Gastroenterology

## 2019-05-24 ENCOUNTER — Encounter (HOSPITAL_COMMUNITY): Payer: Self-pay | Admitting: Gastroenterology

## 2019-05-24 DIAGNOSIS — E119 Type 2 diabetes mellitus without complications: Secondary | ICD-10-CM | POA: Insufficient documentation

## 2019-05-24 DIAGNOSIS — K644 Residual hemorrhoidal skin tags: Secondary | ICD-10-CM | POA: Insufficient documentation

## 2019-05-24 DIAGNOSIS — K449 Diaphragmatic hernia without obstruction or gangrene: Secondary | ICD-10-CM | POA: Diagnosis not present

## 2019-05-24 DIAGNOSIS — Z803 Family history of malignant neoplasm of breast: Secondary | ICD-10-CM | POA: Insufficient documentation

## 2019-05-24 DIAGNOSIS — Z888 Allergy status to other drugs, medicaments and biological substances status: Secondary | ICD-10-CM | POA: Insufficient documentation

## 2019-05-24 DIAGNOSIS — K648 Other hemorrhoids: Secondary | ICD-10-CM | POA: Diagnosis not present

## 2019-05-24 DIAGNOSIS — Z8249 Family history of ischemic heart disease and other diseases of the circulatory system: Secondary | ICD-10-CM | POA: Diagnosis not present

## 2019-05-24 DIAGNOSIS — D120 Benign neoplasm of cecum: Secondary | ICD-10-CM | POA: Diagnosis not present

## 2019-05-24 DIAGNOSIS — Z880 Allergy status to penicillin: Secondary | ICD-10-CM | POA: Diagnosis not present

## 2019-05-24 DIAGNOSIS — K635 Polyp of colon: Secondary | ICD-10-CM | POA: Diagnosis not present

## 2019-05-24 DIAGNOSIS — G43909 Migraine, unspecified, not intractable, without status migrainosus: Secondary | ICD-10-CM | POA: Diagnosis not present

## 2019-05-24 DIAGNOSIS — D649 Anemia, unspecified: Secondary | ICD-10-CM | POA: Diagnosis not present

## 2019-05-24 DIAGNOSIS — Z833 Family history of diabetes mellitus: Secondary | ICD-10-CM | POA: Insufficient documentation

## 2019-05-24 DIAGNOSIS — Z1211 Encounter for screening for malignant neoplasm of colon: Secondary | ICD-10-CM

## 2019-05-24 DIAGNOSIS — Q438 Other specified congenital malformations of intestine: Secondary | ICD-10-CM | POA: Insufficient documentation

## 2019-05-24 DIAGNOSIS — I10 Essential (primary) hypertension: Secondary | ICD-10-CM | POA: Insufficient documentation

## 2019-05-24 HISTORY — PX: COLONOSCOPY: SHX5424

## 2019-05-24 HISTORY — PX: POLYPECTOMY: SHX5525

## 2019-05-24 LAB — GLUCOSE, CAPILLARY: Glucose-Capillary: 100 mg/dL — ABNORMAL HIGH (ref 70–99)

## 2019-05-24 SURGERY — COLONOSCOPY
Anesthesia: Moderate Sedation

## 2019-05-24 MED ORDER — MIDAZOLAM HCL 5 MG/5ML IJ SOLN
INTRAMUSCULAR | Status: AC
  Filled 2019-05-24: qty 10

## 2019-05-24 MED ORDER — MEPERIDINE HCL 100 MG/ML IJ SOLN
INTRAMUSCULAR | Status: DC | PRN
  Administered 2019-05-24 (×3): 25 mg via INTRAVENOUS

## 2019-05-24 MED ORDER — SODIUM CHLORIDE 0.9 % IV SOLN: INTRAVENOUS | Status: DC

## 2019-05-24 MED ORDER — MIDAZOLAM HCL 5 MG/5ML IJ SOLN
INTRAMUSCULAR | Status: DC | PRN
  Administered 2019-05-24 (×3): 2 mg via INTRAVENOUS

## 2019-05-24 MED ORDER — MEPERIDINE HCL 100 MG/ML IJ SOLN
INTRAMUSCULAR | Status: AC
  Filled 2019-05-24: qty 2

## 2019-05-24 MED ORDER — STERILE WATER FOR IRRIGATION IR SOLN
Status: DC | PRN
  Administered 2019-05-24: 10:00:00 2.5 mL

## 2019-05-24 NOTE — Discharge Instructions (Signed)
You had 1 small polyp removed. You have SMALL internal and MODERATE EXTERNAL hemorrhoids.   EAT TO LIVE AND THINK OF FOOD AS MEDICINE. 75% OF YOUR PLATE SHOULD BE FRUITS/VEGGIES.  To have more energy, and to lose weight:      1. CONTINUE YOUR WEIGHT LOSS EFFORTS. I RECOMMEND YOU READ AND FOLLOW RECOMMENDATIONS BY DR. MARK HYMAN, "10-DAY DETOX DIET".    2. If you must eat bread, EAT EZEKIEL BREAD. IT IS IN THE FROZEN SECTION OF THE GROCERY STORE.    3. DRINK WATER WITH FRUIT OR CUCUMBER ADDED. YOUR URINE SHOULD BE LIGHT YELLOW. AVOID SODA, GATORADE, ENERGY DRINKS, OR DIET SODA.     4. AVOID HIGH FRUCTOSE CORN SYRUP AND CAFFEINE.     5. DO NOT chew SUGAR FREE GUM OR USE ARTIFICIAL SWEETENERS. IF NEEDED USE STEVIA AS A SWEETENER.    6. DO NOT EAT ENRICHED WHEAT FLOUR, PASTA, RICE, OR CEREAL.    7. ONLY EAT WILD CAUGHT SEAFOOD, GRASS FED BEEF OR CHICKEN, PORK FROM PASTURE RAISE PIGS, OR EGGS FROM PASTURE RAISED CHICKENS.    8. PRACTICE CHAIR YOGA FOR 15-30 MINS 3 OR 4 TIMES A WEEK AND PROGRESS TO HATHA YOGA OVER NEXT 6 MOS.    9. START TAKING A VITAMIN B12, AND VITAMIN D3 2000 IU DAILY.   For 3 mos, TAKE THESE ADDITIONAL SUPPLEMENTS TO DECREASE CRAVING AND SUPPRESS YOUR APPETITE:    1. CINNAMON 500 MG EVERY AM PRIOR TO FIRST MEAL.   **STABILIZES BLOOD GLUCOSE/REDUCES CRAVINGS**    2. CHROMIUM 400-500 MG WITH MEALS TWICE DAILY.    **FAT BURNER**    3. GREEN TEA EXTRACT ONE DAILY.   **FAT BURNER/SUPPRESSES YOUR APPETITE**    4. ALPHA LIPOIC ACID TWICE DAILY.   **NATURAL ANTI-INFLAMMATORY SUPPLEMENT THAT IS AN ALTERNATIVE TO IBUPROFEN OR NAPROXEN**  USE PREPARATION H FOUR TIMES  A DAY WHEN NEEDED TO RELIEVE RECTAL PAIN/PRESSURE/BLEEDING.   YOUR BIOPSY RESULTS WILL BE BACK IN 5 BUSINESS DAYS.  Next colonoscopy in 5-10 years.    Colonoscopy Care After Read the instructions outlined below and refer to this sheet in the next week. These discharge instructions provide you with  general information on caring for yourself after you leave the hospital. While your treatment has been planned according to the most current medical practices available, unavoidable complications occasionally occur. If you have any problems or questions after discharge, call DR. Nakyia Dau, [phone].  ACTIVITY  You may resume your regular activity, but move at a slower pace for the next 24 hours.   Take frequent rest periods for the next 24 hours.   Walking will help get rid of the air and reduce the bloated feeling in your belly (abdomen).   No driving for 24 hours (because of the medicine (anesthesia) used during the test).   You may shower.   Do not sign any important legal documents or operate any machinery for 24 hours (because of the anesthesia used during the test).    NUTRITION  Drink plenty of fluids.   You may resume your normal diet as instructed by your doctor.   Begin with a light meal and progress to your normal diet. Heavy or fried foods are harder to digest and may make you feel sick to your stomach (nauseated).   Avoid alcoholic beverages for 24 hours or as instructed.    MEDICATIONS  You may resume your normal medications.   WHAT YOU CAN EXPECT TODAY  Some feelings of bloating in the  abdomen.   Passage of more gas than usual.   Spotting of blood in your stool or on the toilet paper  .  IF YOU HAD POLYPS REMOVED DURING THE COLONOSCOPY:  Eat a soft diet IF YOU HAVE NAUSEA, BLOATING, ABDOMINAL PAIN, OR VOMITING.    FINDING OUT THE RESULTS OF YOUR TEST Not all test results are available during your visit. DR. Oneida Alar WILL CALL YOU WITHIN 14 DAYS OF YOUR PROCEDUE WITH YOUR RESULTS. Do not assume everything is normal if you have not heard from DR. Temperance Kelemen, CALL HER OFFICE AT [phone].  SEEK IMMEDIATE MEDICAL ATTENTION AND CALL THE OFFICE: [phone] IF:  You have more than a spotting of blood in your stool.   Your belly is swollen (abdominal  distention).   You are nauseated or vomiting.   You have a temperature over 101F.   You have abdominal pain or discomfort that is severe or gets worse throughout the day.   High-Fiber Diet A high-fiber diet changes your normal diet to include more whole grains, legumes, fruits, and vegetables. Changes in the diet involve replacing refined carbohydrates with unrefined foods. The calorie level of the diet is essentially unchanged. The Dietary Reference Intake (recommended amount) for adult males is 38 grams per day. For adult females, it is 25 grams per day. Pregnant and lactating women should consume 28 grams of fiber per day. Fiber is the intact part of a plant that is not broken down during digestion. Functional fiber is fiber that has been isolated from the plant to provide a beneficial effect in the body.  PURPOSE Increase stool bulk.  Ease and regulate bowel movements.  Lower cholesterol.  REDUCE RISK OF COLON CANCER  INDICATIONS THAT YOU NEED MORE FIBER Constipation and hemorrhoids.  Uncomplicated diverticulosis (intestine condition) and irritable bowel syndrome.  Weight management.  As a protective measure against hardening of the arteries (atherosclerosis), diabetes, and cancer.   GUIDELINES FOR INCREASING FIBER IN THE DIET Start adding fiber to the diet slowly. A gradual increase of about 5 more grams (2 servings of most fruits or vegetables) per day is best. Too rapid an increase in fiber may result in constipation, flatulence, and bloating.  Drink enough water and fluids to keep your urine clear or pale yellow. Water, juice, or caffeine-free drinks are recommended. Not drinking enough fluid may cause constipation.  Eat a variety of high-fiber foods rather than one type of fiber.  Try to increase your intake of fiber through using high-fiber foods rather than fiber pills or supplements that contain small amounts of fiber.  The goal is to change the types of food eaten. Do not  supplement your present diet with high-fiber foods, but replace foods in your present diet.    Polyps, Colon  A polyp is extra tissue that grows inside your body. Colon polyps grow in the large intestine. The large intestine, also called the colon, is part of your digestive system. It is a long, hollow tube at the end of your digestive tract where your body makes and stores stool. Most polyps are not dangerous. They are benign. This means they are not cancerous. But over time, some types of polyps can turn into cancer. Polyps that are smaller than a pea are usually not harmful. But larger polyps could someday become or may already be cancerous. To be safe, doctors remove all polyps and test them.   WHO GETS POLYPS? Anyone can get polyps, but certain people are more likely than others. You  may have a greater chance of getting polyps if:  You are over 50.   You have had polyps before.   Someone in your family has had polyps.   Someone in your family has had cancer of the large intestine.   Find out if someone in your family has had polyps. You may also be more likely to get polyps if you:   Eat a lot of fatty foods   Smoke   Drink alcohol   Do not exercise  Eat too much   PREVENTION There is not one sure way to prevent polyps. You might be able to lower your risk of getting them if you:  Eat more fruits and vegetables and less fatty food.   Do not smoke.   Avoid alcohol.   Exercise every day.   Lose weight if you are overweight.   Eating more calcium and folate can also lower your risk of getting polyps. Some foods that are rich in calcium are milk, cheese, and broccoli. Some foods that are rich in folate are chickpeas, kidney beans, and spinach.

## 2019-05-24 NOTE — H&P (Signed)
Primary Care Physician:  Fayrene Helper, MD Primary Gastroenterologist:  Dr. Oneida Alar  Pre-Procedure History & Physical: HPI:  Alexa Savage is a 49 y.o. female here for COLON CANCER SCREENING.  Past Medical History:  Diagnosis Date  . Allergy   . Anemia   . Bilateral lower extremity edema    occ  . Breast cyst    right  . Diabetes mellitus without complication (HCC)    borderline  . H/O hiatal hernia   . Headache    migraines  . Hypertension   . Sinusitis     Past Surgical History:  Procedure Laterality Date  . DILATATION & CURETTAGE/HYSTEROSCOPY WITH MYOSURE N/A 09/23/2016   Procedure: DILATATION & CURETTAGE/HYSTEROSCOPY WITH MYOSURE;  Surgeon: Servando Salina, MD;  Location: [facility];  Service: Gynecology;  Laterality: N/A;  . MYOMECTOMY N/A 11/26/2013   Procedure: Exploratory Laparotomy MYOMECTOMY;  Surgeon: Marvene Staff, MD;  Location: [facility];  Service: Gynecology;  Laterality: N/A;  . WISDOM TOOTH EXTRACTION      Prior to Admission medications   Medication Sig Start Date End Date Taking? Authorizing Provider  Biotin 5000 MCG TABS Take 5,000 tablets by mouth daily.    Yes [provider]  ergocalciferol (VITAMIN D2) 1.25 MG (50000 UT) capsule Take 1 capsule (50,000 Units total) by mouth once a week. One capsule once weekly Patient taking differently: Take 50,000 Units by mouth once a week. Saturday 04/09/19  Yes Fayrene Helper, MD  hydrocortisone cream (PREPARATION H) 1 % Apply 1 application topically 2 (two) times daily as needed for itching (hemmorroids.).   Yes [provider]  Multiple Vitamins-Minerals (MULTIVITAMIN WITH MINERALS) tablet Take 1 tablet by mouth daily.   Yes [provider]  Olopatadine HCl (PAZEO) 0.7 % SOLN Place 1 drop into both eyes daily as needed (allergy eyes/irritation.).   Yes [provider]  phentermine (ADIPEX-P) 37.5 MG tablet Take one half tablet by mouth every morning before  breakfast Patient taking differently: Take 18.75 mg by mouth daily before breakfast.  04/20/19  Yes Fayrene Helper, MD  triamterene-hydrochlorothiazide (MAXZIDE-25) 37.5-25 MG tablet TAKE ONE AND A HALF TABLETS BY MOUTH ONCE DAILY Patient taking differently: Take 1 tablet by mouth daily.  05/16/18  Yes Fayrene Helper, MD  vitamin A 8000 UNIT capsule Take 8,000 Units by mouth daily.   Yes [provider]    Allergies as of 11/09/2018 - Review Complete 11/09/2018  Allergen Reaction Noted  . Metronidazole Swelling 01/02/2018  . Penicillins Hives and Rash 01/26/2007    Family History  Problem Relation Age of Onset  . Hypertension Mother   . Heart disease Father   . Cancer Maternal Aunt        Breast Cancer  . Hypertension Maternal Aunt   . Diabetes Maternal Grandmother   . Asthma Neg Hx     Social History   Socioeconomic History  . Marital status: Single    Spouse name: Not on file  . Number of children: Not on file  . Years of education: Not on file  . Highest education level: Not on file  Occupational History  . Not on file  Tobacco Use  . Smoking status: Never Smoker  . Smokeless tobacco: Never Used  Substance and Sexual Activity  . Alcohol use: No    Comment: rare  . Drug use: No  . Sexual activity: Not on file  Other Topics Concern  . Not on file  Social History Narrative  .  Not on file   Social Determinants of Health   Financial Resource Strain:   . Difficulty of Paying Living Expenses:   Food Insecurity:   . Worried About Charity fundraiser in the Last Year:   . Arboriculturist in the Last Year:   Transportation Needs:   . Film/video editor (Medical):   Marland Kitchen Lack of Transportation (Non-Medical):   Physical Activity:   . Days of Exercise per Week:   . Minutes of Exercise per Session:   Stress:   . Feeling of Stress :   Social Connections:   . Frequency of Communication with Friends and Family:   . Frequency of Social Gatherings with  Friends and Family:   . Attends Religious Services:   . Active Member of Clubs or Organizations:   . Attends Archivist Meetings:   Marland Kitchen Marital Status:   Intimate Partner Violence:   . Fear of Current or Ex-Partner:   . Emotionally Abused:   Marland Kitchen Physically Abused:   . Sexually Abused:     Review of Systems: See HPI, otherwise negative ROS   Physical Exam: BP 136/88   Pulse 70   Temp 98.6 F (37 C) (Oral)   Resp 20   Ht 5\' 5"  (1.651 m)   Wt 93.9 kg   SpO2 100%   BMI 34.45 kg/m  General:   Alert,  pleasant and cooperative in NAD Head:  Normocephalic and atraumatic. Neck:  Supple; Lungs:  Clear throughout to auscultation.    Heart:  Regular rate and rhythm. Abdomen:  Soft, nontender and nondistended. Normal bowel sounds, without guarding, and without rebound.   Neurologic:  Alert and  oriented x4;  grossly normal neurologically.  Impression/Plan:    SCREENING  Plan:  1. TCS TODAY DISCUSSED PROCEDURE, BENEFITS, & RISKS: < 1% chance of medication reaction, bleeding, perforation, ASPIRATION, or rupture of spleen/liver requiring surgery to fix it and missed polyps < 1 cm 10-20% of the time.

## 2019-05-24 NOTE — Op Note (Signed)
[facility] Patient Name: Alexa Savage Procedure Date: 05/24/2019 9:23 AM MRN: [id number] Date of Birth: 1970/05/31 Attending MD: Barney Drain MD, MD CSN: [id number] Age: 49 Admit Type: Outpatient Procedure:                Colonoscopy WITH COLD SNARE POLYPECTOMY Indications:              Screening for colorectal malignant neoplasm Providers:                Barney Drain MD, MD, Lurline Del, RN, Randa Spike, Technician Referring MD:             Norwood Levo. Simpson MD, MD Medicines:                Meperidine 75 mg IV, Midazolam 6 mg IV Complications:            No immediate complications. Estimated Blood Loss:     Estimated blood loss was minimal. Procedure:                Pre-Anesthesia Assessment:                           - Prior to the procedure, a History and Physical                            was performed, and patient medications and                            allergies were reviewed. The patient's tolerance of                            previous anesthesia was also reviewed. The risks                            and benefits of the procedure and the sedation                            options and risks were discussed with the patient.                            All questions were answered, and informed consent                            was obtained. Prior Anticoagulants: The patient has                            taken no previous anticoagulant or antiplatelet                            agents. ASA Grade Assessment: II - A patient with                            mild systemic disease. After reviewing the risks  and benefits, the patient was deemed in                            satisfactory condition to undergo the procedure.                            After obtaining informed consent, the colonoscope                            was passed under direct vision. Throughout the                            procedure, the  patient's blood pressure, pulse, and                            oxygen saturations were monitored continuously. The                            [id number]) scope was introduced through                            the anus and advanced to the the cecum, identified                            by appendiceal orifice and ileocecal valve. The                            colonoscopy was somewhat difficult due to a                            tortuous colon. Successful completion of the                            procedure was aided by straightening and shortening                            the scope to obtain bowel loop reduction and                            COLOWRAP. The patient tolerated the procedure well.                            The quality of the bowel preparation was excellent.                            The ileocecal valve, appendiceal orifice, and                            rectum were photographed. Scope In: 9:59:42 AM Scope Out: 10:14:36 AM Scope Withdrawal Time: 0 hours 11 minutes 31 seconds  Total Procedure Duration: 0 hours 14 minutes 54 seconds  Findings:      A 2 mm polyp was found in the cecum. The polyp was sessile. The polyp  was removed with a cold snare. Resection and retrieval were complete.      External and internal hemorrhoids were found.      The recto-sigmoid colon and sigmoid colon were moderately tortuous. Impression:               - One 2 mm polyp in the cecum, removed with a cold                            snare. Resected and retrieved.                           - External and internal hemorrhoids.                           - Tortuous colon. Moderate Sedation:      Moderate (conscious) sedation was administered by the endoscopy nurse       and supervised by the endoscopist. The following parameters were       monitored: oxygen saturation, heart rate, blood pressure, and response       to care. Total physician intraservice time was 31  minutes. Recommendation:           - Patient has a contact number available for                            emergencies. The signs and symptoms of potential                            delayed complications were discussed with the                            patient. Return to normal activities tomorrow.                            Written discharge instructions were provided to the                            patient.                           - High fiber diet. LOSE WEIGHT TO BMI < 30.                           - Continue present medications.                           - Await pathology results.                           - Repeat colonoscopy in 5-10 years for surveillance. Procedure Code(s):        --- Professional ---                           [phone], Colonoscopy, flexible; with removal of                            tumor(s), polyp(s),  or other lesion(s) by snare                            technique                           99153, Moderate sedation; each additional 15                            minutes intraservice time                           G0500, Moderate sedation services provided by the                            same physician or other qualified health care                            professional performing a gastrointestinal                            endoscopic service that sedation supports,                            requiring the presence of an independent trained                            observer to assist in the monitoring of the                            patient's level of consciousness and physiological                            status; initial 15 minutes of intra-service time;                            patient age 17 years or older (additional time may                            be reported with [phone], as appropriate) Diagnosis Code(s):        --- Professional ---                           Z12.11, Encounter for screening for malignant                             neoplasm of colon                           K63.5, Polyp of colon                           K64.8, Other hemorrhoids                           Q43.8, Other specified congenital malformations of  intestine CPT copyright 2019 [employer]. All rights reserved. The codes documented in this report are preliminary and upon coder review may  be revised to meet current compliance requirements. Barney Drain, MD Barney Drain MD, MD 05/24/2019 10:40:53 AM This report has been signed electronically. Number of Addenda: 0

## 2019-05-25 LAB — SURGICAL PATHOLOGY

## 2019-06-01 ENCOUNTER — Encounter: Payer: Self-pay | Admitting: Gastroenterology

## 2019-06-19 ENCOUNTER — Ambulatory Visit: Payer: BC Managed Care – PPO

## 2019-06-20 ENCOUNTER — Ambulatory Visit (INDEPENDENT_AMBULATORY_CARE_PROVIDER_SITE_OTHER): Payer: BC Managed Care – PPO

## 2019-06-20 ENCOUNTER — Other Ambulatory Visit: Payer: Self-pay

## 2019-06-20 DIAGNOSIS — Z23 Encounter for immunization: Secondary | ICD-10-CM

## 2019-08-23 ENCOUNTER — Ambulatory Visit: Payer: BC Managed Care – PPO | Admitting: Family Medicine

## 2019-08-28 ENCOUNTER — Other Ambulatory Visit: Payer: Self-pay

## 2019-08-28 ENCOUNTER — Encounter: Payer: Self-pay | Admitting: Family Medicine

## 2019-08-28 ENCOUNTER — Ambulatory Visit (INDEPENDENT_AMBULATORY_CARE_PROVIDER_SITE_OTHER): Payer: BC Managed Care – PPO | Admitting: Family Medicine

## 2019-08-28 VITALS — BP 155/99 | HR 86 | Resp 16 | Ht 65.0 in | Wt 201.0 lb

## 2019-08-28 DIAGNOSIS — E669 Obesity, unspecified: Secondary | ICD-10-CM | POA: Diagnosis not present

## 2019-08-28 DIAGNOSIS — Z1159 Encounter for screening for other viral diseases: Secondary | ICD-10-CM | POA: Diagnosis not present

## 2019-08-28 DIAGNOSIS — I10 Essential (primary) hypertension: Secondary | ICD-10-CM | POA: Diagnosis not present

## 2019-08-28 MED ORDER — TRIAMTERENE-HCTZ 75-50 MG PO TABS: 1.0000 | ORAL_TABLET | Freq: Every day | ORAL | 3 refills | Status: DC

## 2019-08-28 NOTE — Patient Instructions (Signed)
In office re eval blood pressure in 4 to 5 weeks, call if you need me sooner.  Increase maxzide to 75/50 one daily, you may take TWO of your current maxzide 37.5 tablets  together daily till done or just start the new script tomorrow  Non fast chem 7 and EGFr and Hep C screen 3 to 5 days before next visit ( [facility])  CONGRATS on excellent weight loss , keep it up and en joy the exercise  It is important that you exercise regularly at least 30 minutes 5 times a week. If you develop chest pain, have severe difficulty breathing, or feel very tired, stop exercising immediately and seek medical attention   Think about what you will eat, plan ahead. Choose " clean, green, fresh or frozen" over canned, processed or packaged foods which are more sugary, salty and fatty. 70 to 75% of food eaten should be vegetables and fruit. Three meals at set times with snacks allowed between meals, but they must be fruit or vegetables. Aim to eat over a 12 hour period , example 7 am to 7 pm, and STOP after  your last meal of the day. Drink water,generally about 64 ounces per day, no other drink is as healthy. Fruit juice is best enjoyed in a healthy way, by EATING the fruit. Thanks for choosing [facility], we consider it a privelige to serve you.

## 2019-08-30 ENCOUNTER — Encounter: Payer: Self-pay | Admitting: Family Medicine

## 2019-08-30 NOTE — Assessment & Plan Note (Signed)
Excellent response to phentermine as well as lifestyle change.  It is discontinued phentermine currently however because of elevated blood pressure.  Will reevaluate in 4 to 8 weeks.  Patient applauded on good response and encouraged to continue lifestyle change.

## 2019-08-30 NOTE — Assessment & Plan Note (Signed)
Uncontrolled hypertension.  Stop phentermine.  New additional medication started for blood pressure.  Follow-up in 4 to 8 weeks. DASH diet and commitment to daily physical activity for a minimum of 30 minutes discussed and encouraged, as a part of hypertension management. The importance of attaining a healthy weight is also discussed.  BP/Weight 08/28/2019 05/24/2019 04/20/2019 12/26/2018 06/26/2018 12/13/2017 5/63/8756  Systolic BP 433 295 188 416 606 301 601  Diastolic BP 99 81 82 86 80 80 84  Wt. (Lbs) 201 207 208 223 217 219 208  BMI 33.45 34.45 34.61 37.11 36.11 36.44 34.61

## 2019-08-30 NOTE — Progress Notes (Signed)
   KAIDA GAMES     MRN: [id number]      DOB: 1971-01-08   HPI Ms. Baynes is here for follow up and re-evaluation of chronic medical conditions, medication management and review of any available recent lab and radiology data.  Preventive health is updated, specifically  Cancer screening and Immunization.   Questions or concerns regarding consultations or procedures which the PT has had in the interim are  addressed. The PT denies any adverse reactions to current medications since the last visit.  There are no new concerns.  There are no specific complaints   ROS Denies recent fever or chills. Denies sinus pressure, nasal congestion, ear pain or sore throat. Denies chest congestion, productive cough or wheezing. Denies chest pains, palpitations and leg swelling Denies abdominal pain, nausea, vomiting,diarrhea or constipation.   Denies dysuria, frequency, hesitancy or incontinence. Denies joint pain, swelling and limitation in mobility. Denies headaches, seizures, numbness, or tingling. Denies depression, anxiety or insomnia. Denies skin break down or rash.   PE  BP (!) 155/99   Pulse 86   Resp 16   Ht 5\' 5"  (1.651 m)   Wt 201 lb (91.2 kg)   SpO2 100%   BMI 33.45 kg/m   Patient alert and oriented and in no cardiopulmonary distress.  HEENT: No facial asymmetry, EOMI,     Neck supple .  Chest: Clear to auscultation bilaterally.  CVS: S1, S2 no murmurs, no S3.Regular rate.  ABD: Soft non tender.   Ext: No edema  MS: Adequate ROM spine, shoulders, hips and knees.  Skin: Intact, no ulcerations or rash noted.  Psych: Good eye contact, normal affect. Memory intact not anxious or depressed appearing.  CNS: CN 2-12 intact, power,  normal throughout.no focal deficits noted.   Assessment & Plan  HTN (hypertension) Uncontrolled hypertension.  Stop phentermine.  New additional medication started for blood pressure.  Follow-up in 4 to 8 weeks. DASH diet and  commitment to daily physical activity for a minimum of 30 minutes discussed and encouraged, as a part of hypertension management. The importance of attaining a healthy weight is also discussed.  BP/Weight 08/28/2019 05/24/2019 04/20/2019 12/26/2018 06/26/2018 12/13/2017 9/67/8938  Systolic BP 101 751 025 852 778 242 353  Diastolic BP 99 81 82 86 80 80 84  Wt. (Lbs) 201 207 208 223 217 219 208  BMI 33.45 34.45 34.61 37.11 36.11 36.44 34.61       Obesity, Class II, BMI 35-39.9 Excellent response to phentermine as well as lifestyle change.  It is discontinued phentermine currently however because of elevated blood pressure.  Will reevaluate in 4 to 8 weeks.  Patient applauded on good response and encouraged to continue lifestyle change.  \

## 2019-09-25 ENCOUNTER — Ambulatory Visit: Payer: BC Managed Care – PPO | Admitting: Family Medicine

## 2019-10-11 ENCOUNTER — Other Ambulatory Visit: Payer: BC Managed Care – PPO

## 2019-10-31 ENCOUNTER — Ambulatory Visit (INDEPENDENT_AMBULATORY_CARE_PROVIDER_SITE_OTHER): Payer: BC Managed Care – PPO | Admitting: Family Medicine

## 2019-10-31 ENCOUNTER — Other Ambulatory Visit: Payer: Self-pay

## 2019-10-31 ENCOUNTER — Encounter: Payer: Self-pay | Admitting: Family Medicine

## 2019-10-31 VITALS — BP 155/97 | HR 74 | Resp 16 | Ht 65.0 in | Wt 202.0 lb

## 2019-10-31 DIAGNOSIS — I10 Essential (primary) hypertension: Secondary | ICD-10-CM

## 2019-10-31 DIAGNOSIS — Z23 Encounter for immunization: Secondary | ICD-10-CM | POA: Diagnosis not present

## 2019-10-31 DIAGNOSIS — E669 Obesity, unspecified: Secondary | ICD-10-CM | POA: Diagnosis not present

## 2019-10-31 NOTE — Patient Instructions (Signed)
F/U 2nd week in October, late afternoon appt preferred, please schedule, call if you need me before   Please get labs 5 days before your October appointment  Please commit to taking your Blood pressure medication every day  Flu vaccine today  Please contact me via my chart if after consistently taking blood pressure medication daily at the same time , your BP is still higher than 140/90  It is important that you exercise regularly at least 30 minutes 5 times a week. If you develop chest pain, have severe difficulty breathing, or feel very tired, stop exercising immediately and seek medical attention  Think about what you will eat, plan ahead. Choose " clean, green, fresh or frozen" over canned, processed or packaged foods which are more sugary, salty and fatty. 70 to 75% of food eaten should be vegetables and fruit. Three meals at set times with snacks allowed between meals, but they must be fruit or vegetables. Aim to eat over a 12 hour period , example 7 am to 7 pm, and STOP after  your last meal of the day. Drink water,generally about 64 ounces per day, no other drink is as healthy. Fruit juice is best enjoyed in a healthy way, by EATING the fruit.

## 2019-11-01 ENCOUNTER — Encounter: Payer: Self-pay | Admitting: Family Medicine

## 2019-11-01 NOTE — Assessment & Plan Note (Signed)
  Patient re-educated about  the importance of commitment to a  minimum of 150 minutes of exercise per week as able.  The importance of healthy food choices with portion control discussed, as well as eating regularly and within a 12 hour window most days. The need to choose "clean , green" food 50 to 75% of the time is discussed, as well as to make water the primary drink and set a goal of 64 ounces water daily.   holding phentermine  Until BP  normalizes

## 2019-11-01 NOTE — Progress Notes (Signed)
   Alexa Savage     MRN: [id number]      DOB: [birth year]   HPI Alexa Savage is here for follow up and re-evaluation of uncontrolled hypertension. Reports that she still takes medication irregularly Has been working on diet and exercise with essentially stable weight, hopes to resume phentermine when blood pressure normalizes. Denies adverse side effects with blood pressure ned, just ' forgets" ROS Denies recent fever or chills. Denies sinus pressure, nasal congestion, ear pain or sore throat. Denies chest congestion, productive cough or wheezing. Denies chest pains, palpitations and leg swelling    PE  BP (!) 155/97   Pulse 74   Resp 16   Wt 202 lb (91.6 kg)   SpO2 97%   BMI 33.61 kg/m   Patient alert and oriented and in no cardiopulmonary distress.  HEENT: No facial asymmetry, EOMI,     Neck supple .  Chest: Clear to auscultation bilaterally.  CVS: S1, S2 no murmurs, no S3.Regular rate.     Ext: No edema  MS: Adequate ROM spine, shoulders, hips and knees.  Psych: Good eye contact, normal affect. Memory intact not anxious or depressed appearing.  CNS: CN 2-12 intact, power,  normal throughout.no focal deficits noted.   Assessment & Plan  HTN (hypertension) Uncontrolled, no cpmpliant with medication and will change DASH diet and commitment to daily physical activity for a minimum of 30 minutes discussed and encouraged, as a part of hypertension management. The importance of attaining a healthy weight is also discussed.  BP/Weight 10/31/2019 08/28/2019 05/24/2019 04/20/2019 12/26/2018 06/26/2018 59/45/8592  Systolic BP 924 462 [phone]  Diastolic BP 97 99 81 82 86 80 80  Wt. (Lbs) 202 201 207 208 223 217 219  BMI 33.61 33.45 34.45 34.61 37.11 36.11 36.44       Obesity, Class II, BMI 35-39.9  Patient re-educated about  the importance of commitment to a  minimum of 150 minutes of exercise per week as able.  The importance of healthy food choices  with portion control discussed, as well as eating regularly and within a 12 hour window most days. The need to choose "clean , green" food 50 to 75% of the time is discussed, as well as to make water the primary drink and set a goal of 64 ounces water daily.   holding phentermine  Until BP  normalizes

## 2019-11-01 NOTE — Assessment & Plan Note (Signed)
Uncontrolled, no cpmpliant with medication and will change DASH diet and commitment to daily physical activity for a minimum of 30 minutes discussed and encouraged, as a part of hypertension management. The importance of attaining a healthy weight is also discussed.  BP/Weight 10/31/2019 08/28/2019 05/24/2019 04/20/2019 12/26/2018 06/26/2018 98/26/4158  Systolic BP 309 407 680 881 103 159 458  Diastolic BP 97 99 81 82 86 80 80  Wt. (Lbs) 202 201 207 208 223 217 219  BMI 33.61 33.45 34.45 34.61 37.11 36.11 36.44

## 2019-12-08 LAB — BMP8+EGFR
BUN/Creatinine Ratio: 13 (ref 9–23)
BUN: 12 mg/dL (ref 6–24)
CO2: 23 mmol/L (ref 20–29)
Calcium: 9.7 mg/dL (ref 8.7–10.2)
Chloride: 105 mmol/L (ref 96–106)
Creatinine, Ser: 0.93 mg/dL (ref 0.57–1.00)
GFR calc Af Amer: 83 mL/min/{1.73_m2} (ref >59)
GFR calc non Af Amer: 72 mL/min/{1.73_m2} (ref >59)
Glucose: 83 mg/dL (ref 65–99)
Potassium: 4.9 mmol/L (ref 3.5–5.2)
Sodium: 143 mmol/L (ref 134–144)

## 2019-12-08 LAB — HEPATITIS C ANTIBODY: Hep C Virus Ab: <0.1 s/co ratio (ref 0.0–0.9)

## 2019-12-12 ENCOUNTER — Ambulatory Visit (INDEPENDENT_AMBULATORY_CARE_PROVIDER_SITE_OTHER): Payer: BC Managed Care – PPO | Admitting: Family Medicine

## 2019-12-12 ENCOUNTER — Other Ambulatory Visit: Payer: Self-pay

## 2019-12-12 ENCOUNTER — Encounter: Payer: Self-pay | Admitting: Family Medicine

## 2019-12-12 VITALS — BP 180/102 | HR 90 | Resp 16 | Ht 65.0 in | Wt 203.1 lb

## 2019-12-12 DIAGNOSIS — I10 Essential (primary) hypertension: Secondary | ICD-10-CM | POA: Diagnosis not present

## 2019-12-12 DIAGNOSIS — E669 Obesity, unspecified: Secondary | ICD-10-CM

## 2019-12-12 NOTE — Patient Instructions (Addendum)
F/U on Monday 8 am with mD, bring blood pressure cuff ( courtesy re check BP)   Take the blood pressure medication at the same time every day  Labs are excellent  I believe that you will need an additional; tablet to get your blood pressure consistently less tha 140/90  Work excuse from 10/28 to return 12/17/2019     Managing Your Hypertension Hypertension is commonly called high blood pressure. This is when the force of your blood pressing against the walls of your arteries is too strong. Arteries are blood vessels that carry blood from your heart throughout your body. Hypertension forces the heart to work harder to pump blood, and may cause the arteries to become narrow or stiff. Having untreated or uncontrolled hypertension can cause heart attack, stroke, kidney disease, and other problems. What are blood pressure readings? A blood pressure reading consists of a higher number over a lower number. Ideally, your blood pressure should be below 120/80. The first ("top") number is called the systolic pressure. It is a measure of the pressure in your arteries as your heart beats. The second ("bottom") number is called the diastolic pressure. It is a measure of the pressure in your arteries as the heart relaxes. What does my blood pressure reading mean? Blood pressure is classified into four stages. Based on your blood pressure reading, your health care provider may use the following stages to determine what type of treatment you need, if any. Systolic pressure and diastolic pressure are measured in a unit called mm Hg. Normal  Systolic pressure: below 295.  Diastolic pressure: below 80. Elevated  Systolic pressure: 284-132.  Diastolic pressure: below 80. Hypertension stage 1  Systolic pressure: 440-102.  Diastolic pressure: 72-53. Hypertension stage 2  Systolic pressure: 664 or above.  Diastolic pressure: 90 or above. What health risks are associated with hypertension? Managing  your hypertension is an important responsibility. Uncontrolled hypertension can lead to:  A heart attack.  A stroke.  A weakened blood vessel (aneurysm).  Heart failure.  Kidney damage.  Eye damage.  Metabolic syndrome.  Memory and concentration problems. What changes can I make to manage my hypertension? Hypertension can be managed by making lifestyle changes and possibly by taking medicines. Your health care provider will help you make a plan to bring your blood pressure within a normal range. Eating and drinking   Eat a diet that is high in fiber and potassium, and low in salt (sodium), added sugar, and fat. An example eating plan is called the DASH (Dietary Approaches to Stop Hypertension) diet. To eat this way: ? Eat plenty of fresh fruits and vegetables. Try to fill half of your plate at each meal with fruits and vegetables. ? Eat whole grains, such as whole wheat pasta, brown rice, or whole grain bread. Fill about one quarter of your plate with whole grains. ? Eat low-fat diary products. ? Avoid fatty cuts of meat, processed or cured meats, and poultry with skin. Fill about one quarter of your plate with lean proteins such as fish, chicken without skin, beans, eggs, and tofu. ? Avoid premade and processed foods. These tend to be higher in sodium, added sugar, and fat.  Reduce your daily sodium intake. Most people with hypertension should eat less than 1,500 mg of sodium a day.  Limit alcohol intake to no more than 1 drink a day for nonpregnant women and 2 drinks a day for men. One drink equals 12 oz of beer, 5 oz of wine,  or 1 oz of hard liquor. Lifestyle  Work with your health care provider to maintain a healthy body weight, or to lose weight. Ask what an ideal weight is for you.  Get at least 30 minutes of exercise that causes your heart to beat faster (aerobic exercise) most days of the week. Activities may include walking, swimming, or biking.  Include exercise to  strengthen your muscles (resistance exercise), such as weight lifting, as part of your weekly exercise routine. Try to do these types of exercises for 30 minutes at least 3 days a week.  Do not use any products that contain nicotine or tobacco, such as cigarettes and e-cigarettes. If you need help quitting, ask your health care provider.  Control any long-term (chronic) conditions you have, such as high cholesterol or diabetes. Monitoring  Monitor your blood pressure at home as told by your health care provider. Your personal target blood pressure may vary depending on your medical conditions, your age, and other factors.  Have your blood pressure checked regularly, as often as told by your health care provider. Working with your health care provider  Review all the medicines you take with your health care provider because there may be side effects or interactions.  Talk with your health care provider about your diet, exercise habits, and other lifestyle factors that may be contributing to hypertension.  Visit your health care provider regularly. Your health care provider can help you create and adjust your plan for managing hypertension. Will I need medicine to control my blood pressure? Your health care provider may prescribe medicine if lifestyle changes are not enough to get your blood pressure under control, and if:  Your systolic blood pressure is 130 or higher.  Your diastolic blood pressure is 80 or higher. Take medicines only as told by your health care provider. Follow the directions carefully. Blood pressure medicines must be taken as prescribed. The medicine does not work as well when you skip doses. Skipping doses also puts you at risk for problems. Contact a health care provider if:  You think you are having a reaction to medicines you have taken.  You have repeated (recurrent) headaches.  You feel dizzy.  You have swelling in your ankles.  You have trouble with your  vision. Get help right away if:  You develop a severe headache or confusion.  You have unusual weakness or numbness, or you feel faint.  You have severe pain in your chest or abdomen.  You vomit repeatedly.  You have trouble breathing. Summary  Hypertension is when the force of blood pumping through your arteries is too strong. If this condition is not controlled, it may put you at risk for serious complications.  Your personal target blood pressure may vary depending on your medical conditions, your age, and other factors. For most people, a normal blood pressure is less than 120/80.  Hypertension is managed by lifestyle changes, medicines, or both. Lifestyle changes include weight loss, eating a healthy, low-sodium diet, exercising more, and limiting alcohol. This information is not intended to replace advice given to you by your health care provider. Make sure you discuss any questions you have with your health care provider. Document Revised: 05/26/2018 Document Reviewed: 12/31/2015 Elsevier Patient Education  [place].

## 2019-12-15 ENCOUNTER — Encounter: Payer: Self-pay | Admitting: Family Medicine

## 2019-12-15 MED ORDER — ERGOCALCIFEROL 1.25 MG (50000 UT) PO CAPS: 50000.0000 [IU] | ORAL_CAPSULE | ORAL | 1 refills | Status: DC

## 2019-12-15 NOTE — Assessment & Plan Note (Signed)
unchnaged  Patient re-educated about  the importance of commitment to a  minimum of 150 minutes of exercise per week as able.  The importance of healthy food choices with portion control discussed, as well as eating regularly and within a 12 hour window most days. The need to choose "clean , green" food 50 to 75% of the time is discussed, as well as to make water the primary drink and set a goal of 64 ounces water daily.    Weight /BMI 12/12/2019 10/31/2019 08/28/2019  WEIGHT 203 lb 1.9 oz 202 lb 201 lb  HEIGHT 5\' 5"  5\' 5"  5\' 5"   BMI 33.8 kg/m2 33.61 kg/m2 33.45 kg/m2

## 2019-12-15 NOTE — Assessment & Plan Note (Signed)
Uncontrolled, patient taken out of work with re eval in 5 days, I  Believe that she needs additional medication, she is resistant currently DASH diet and commitment to daily physical activity for a minimum of 30 minutes discussed and encouraged, as a part of hypertension management. The importance of attaining a healthy weight is also discussed.  BP/Weight 12/12/2019 10/31/2019 08/28/2019 05/24/2019 04/20/2019 12/26/2018 8/88/7579  Systolic BP 728 206 015 615 379 432 761  Diastolic BP 470 97 99 81 82 86 80  Wt. (Lbs) 203.12 202 201 207 208 223 217  BMI 33.8 33.61 33.45 34.45 34.61 37.11 36.11  Stress playing  A major role also

## 2019-12-15 NOTE — Progress Notes (Signed)
   Alexa Savage     MRN: [id number]      DOB: 02-02-1971   HPI Ms. Stambaugh is here for follow up and re-evaluation of uncontrolled blood pressure , medication management and review of any available recent lab and radiology data.  The PT denies any adverse reactions to current medications since the last visit.  C/o excess stress on the job due to shortage of staff ROS Denies recent fever or chills. Denies sinus pressure, nasal congestion, ear pain or sore throat. Denies chest congestion, productive cough or wheezing. Denies chest pains, palpitations and leg swelling Denies abdominal pain, nausea, vomiting,diarrhea or constipation.   Denies dysuria, frequency, hesitancy or incontinence. Denies joint pain, swelling and limitation in mobility. Denies headaches, seizures, numbness, or tingling.  Denies skin break down or rash.   PE  BP (!) 180/102   Pulse 90   Resp 16   Ht 5\' 5"  (1.651 m)   Wt 203 lb 1.9 oz (92.1 kg)   SpO2 98%   BMI 33.80 kg/m   Patient alert and oriented and in no cardiopulmonary distress.  HEENT: No facial asymmetry, EOMI,     Neck supple .  Chest: Clear to auscultation bilaterally.  CVS: S1, S2 no murmurs, no S3.Regular rate.  Ext: No edema  MS: Adequate ROM spine, shoulders, hips and knees.  Skin: Intact, no ulcerations or rash noted.    Assessment & Plan  HTN (hypertension) Uncontrolled, patient taken out of work with re eval in 5 days, I  Believe that she needs additional medication, she is resistant currently DASH diet and commitment to daily physical activity for a minimum of 30 minutes discussed and encouraged, as a part of hypertension management. The importance of attaining a healthy weight is also discussed.  BP/Weight 12/12/2019 10/31/2019 08/28/2019 05/24/2019 04/20/2019 12/26/2018 4/31/5400  Systolic BP 867 619 509 326 712 458 099  Diastolic BP 833 97 99 81 82 86 80  Wt. (Lbs) 203.12 202 201 207 208 223 217  BMI 33.8 33.61 33.45  34.45 34.61 37.11 36.11  Stress playing  A major role also     Obesity, Class II, BMI 35-39.9 unchnaged  Patient re-educated about  the importance of commitment to a  minimum of 150 minutes of exercise per week as able.  The importance of healthy food choices with portion control discussed, as well as eating regularly and within a 12 hour window most days. The need to choose "clean , green" food 50 to 75% of the time is discussed, as well as to make water the primary drink and set a goal of 64 ounces water daily.    Weight /BMI 12/12/2019 10/31/2019 08/28/2019  WEIGHT 203 lb 1.9 oz 202 lb 201 lb  HEIGHT 5\' 5"  5\' 5"  5\' 5"   BMI 33.8 kg/m2 33.61 kg/m2 33.45 kg/m2

## 2019-12-17 ENCOUNTER — Other Ambulatory Visit: Payer: Self-pay

## 2019-12-17 ENCOUNTER — Telehealth: Payer: Self-pay | Admitting: Family Medicine

## 2019-12-17 ENCOUNTER — Ambulatory Visit: Payer: BC Managed Care – PPO

## 2019-12-17 MED ORDER — AMLODIPINE BESYLATE 2.5 MG PO TABS: 2.5000 mg | ORAL_TABLET | Freq: Every day | ORAL | 3 refills | Status: DC

## 2019-12-17 NOTE — Telephone Encounter (Signed)
Patient in fopr re evaluation of blood pressure My value 140/92  Her meter 143/90  Will add amlodipine 2.5 mg one daily to current medication , needs office re eval mid to end January

## 2020-01-31 ENCOUNTER — Other Ambulatory Visit: Payer: Self-pay | Admitting: Family Medicine

## 2020-01-31 DIAGNOSIS — Z1231 Encounter for screening mammogram for malignant neoplasm of breast: Secondary | ICD-10-CM

## 2020-03-03 ENCOUNTER — Other Ambulatory Visit: Payer: Self-pay

## 2020-03-03 ENCOUNTER — Encounter: Payer: Self-pay | Admitting: Family Medicine

## 2020-03-03 ENCOUNTER — Telehealth (INDEPENDENT_AMBULATORY_CARE_PROVIDER_SITE_OTHER): Payer: BC Managed Care – PPO | Admitting: Family Medicine

## 2020-03-03 VITALS — BP 106/58 | Ht 65.0 in | Wt 202.0 lb

## 2020-03-03 DIAGNOSIS — I10 Essential (primary) hypertension: Secondary | ICD-10-CM

## 2020-03-03 DIAGNOSIS — D126 Benign neoplasm of colon, unspecified: Secondary | ICD-10-CM | POA: Diagnosis not present

## 2020-03-03 DIAGNOSIS — E669 Obesity, unspecified: Secondary | ICD-10-CM | POA: Diagnosis not present

## 2020-03-03 DIAGNOSIS — M546 Pain in thoracic spine: Secondary | ICD-10-CM | POA: Diagnosis not present

## 2020-03-03 MED ORDER — TRIAMTERENE-HCTZ 75-50 MG PO TABS: 1.0000 | ORAL_TABLET | Freq: Every day | ORAL | 1 refills | Status: DC

## 2020-03-03 NOTE — Assessment & Plan Note (Signed)
2 week h/o intermittent pain up to an 8, need x ray of  T spine. NoGI or renal symptoms associated with the pain. Tylenol as needed, for relief

## 2020-03-03 NOTE — Addendum Note (Signed)
Addended by: Eual Fines on: 03/03/2020 04:54 PM   Modules accepted: Orders

## 2020-03-03 NOTE — Assessment & Plan Note (Signed)
  Patient re-educated about  the importance of commitment to a  minimum of 150 minutes of exercise per week as able.  The importance of healthy food choices with portion control discussed, as well as eating regularly and within a 12 hour window most days. The need to choose "clean , green" food 50 to 75% of the time is discussed, as well as to make water the primary drink and set a goal of 64 ounces water daily.    Weight /BMI 03/03/2020 12/12/2019 10/31/2019  WEIGHT 202 lb 203 lb 1.9 oz 202 lb  HEIGHT 5\' 5"  5\' 5"  5\' 5"   BMI 33.61 kg/m2 33.8 kg/m2 33.61 kg/m2

## 2020-03-03 NOTE — Progress Notes (Signed)
Virtual Visit via Telephone Note  I connected with Alexa Savage on 03/03/20 at  4:00 PM EST by telephone and verified that I am speaking with the correct person using two identifiers.  Location: Patient: home Provider: work   I discussed the limitations, risks, security and privacy concerns of performing an evaluation and management service by telephone and the availability of in person appointments. I also discussed with the patient that there may be a patient responsible charge related to this service. The patient expressed understanding and agreed to proceed.   History of Present Illness: 2 week h/o mid back pain, no specific aggravating or relieving factors up to an 8, laats up to 5 to 10 mins.on average twice per week No abdominal pain Had lower cramping pain 2 weeks ago like menses No excessive Reports blood pressure sometimes less than 110, wants to d/c amlodipine and work on lifestyle Observations/Objective: BP (!) 106/58   Ht 5\' 5"  (1.651 m)   Wt 202 lb (91.6 kg)   BMI 33.61 kg/m  Good communication with no confusion and intact memory. Alert and oriented x 3 No signs of respiratory distress during speech    Assessment and Plan: HTN (hypertension) Controlled, stop amlodipine DASH diet and commitment to daily physical activity for a minimum of 30 minutes discussed and encouraged, as a part of hypertension management. The importance of attaining a healthy weight is also discussed.  BP/Weight 03/03/2020 12/12/2019 10/31/2019 08/28/2019 05/24/2019 04/20/2019 29/79/8921  Systolic BP 194 174 081 448 185 631 497  Diastolic BP 58 026 97 99 81 82 86  Wt. (Lbs) 202 203.12 202 201 207 208 223  BMI 33.61 33.8 33.61 33.45 34.45 34.61 37.11        Thoracic spine pain 2 week h/o intermittent pain up to an 8, need x ray of  T spine. NoGI or renal symptoms associated with the pain. Tylenol as needed, for relief  Obesity, Class II, BMI 35-39.9  Patient re-educated about  the  importance of commitment to a  minimum of 150 minutes of exercise per week as able.  The importance of healthy food choices with portion control discussed, as well as eating regularly and within a 12 hour window most days. The need to choose "clean , green" food 50 to 75% of the time is discussed, as well as to make water the primary drink and set a goal of 64 ounces water daily.    Weight /BMI 03/03/2020 12/12/2019 10/31/2019  WEIGHT 202 lb 203 lb 1.9 oz 202 lb  HEIGHT 5\' 5"  5\' 5"  5\' 5"   BMI 33.61 kg/m2 33.8 kg/m2 33.61 kg/m2      Tubular adenoma of colon Needs rept colonoscopy in 05/2024, she is aware    Follow Up Instructions:    I discussed the assessment and treatment plan with the patient. The patient was provided an opportunity to ask questions and all were answered. The patient agreed with the plan and demonstrated an understanding of the instructions.   The patient was advised to call back or seek an in-person evaluation if the symptoms worsen or if the condition fails to improve as anticipated.  I provided 20  minutes of non-face-to-face time during this encounter.   Tula Nakayama, MD

## 2020-03-03 NOTE — Assessment & Plan Note (Signed)
Controlled, stop amlodipine DASH diet and commitment to daily physical activity for a minimum of 30 minutes discussed and encouraged, as a part of hypertension management. The importance of attaining a healthy weight is also discussed.  BP/Weight 03/03/2020 12/12/2019 10/31/2019 08/28/2019 05/24/2019 04/20/2019 80/88/1103  Systolic BP 159 458 592 924 462 863 817  Diastolic BP 58 711 97 99 81 82 86  Wt. (Lbs) 202 203.12 202 201 207 208 223  BMI 33.61 33.8 33.61 33.45 34.45 34.61 37.11

## 2020-03-03 NOTE — Patient Instructions (Addendum)
F/U in office with MD , re evaluate blood pressure and weight in early June, call if you need me sooner  STOP amlodipine, only blood pressure medication Is  maxzide 75/50 one daily  Please get X ray of mid back to evaluate the new back pain you are having. Ok to use tylenol as needed, for pain   Fasting chem 7 and EGFR 1 week before next visit Next colonoscopy is due I 05/2024  It is important that you exercise regularly at least 30 minutes 5 times a week. If you develop chest pain, have severe difficulty breathing, or feel very tired, stop exercising immediately and seek medical attention  Think about what you will eat, plan ahead. Choose " clean, green, fresh or frozen" over canned, processed or packaged foods which are more sugary, salty and fatty. 70 to 75% of food eaten should be vegetables and fruit. Three meals at set times with snacks allowed between meals, but they must be fruit or vegetables. Aim to eat over a 12 hour period , example 7 am to 7 pm, and STOP after  your last meal of the day. Drink water,generally about 64 ounces per day, no other drink is as healthy. Fruit juice is best enjoyed in a healthy way, by EATING the fruit. Thanks for choosing [facility], we consider it a privelige to serve you.

## 2020-03-03 NOTE — Assessment & Plan Note (Signed)
Needs rept colonoscopy in 05/2024, she is aware

## 2020-03-12 ENCOUNTER — Other Ambulatory Visit: Payer: Self-pay

## 2020-03-12 ENCOUNTER — Ambulatory Visit
Admission: RE | Admit: 2020-03-12 | Discharge: 2020-03-12 | Disposition: A | Discharge status: Home / Self Care | Payer: BC Managed Care – PPO | Source: Ambulatory Visit | Attending: Family Medicine | Admitting: Family Medicine

## 2020-03-12 DIAGNOSIS — Z1231 Encounter for screening mammogram for malignant neoplasm of breast: Secondary | ICD-10-CM

## 2020-05-08 ENCOUNTER — Ambulatory Visit: Payer: BC Managed Care – PPO | Admitting: Internal Medicine

## 2020-05-08 ENCOUNTER — Other Ambulatory Visit: Payer: Self-pay

## 2020-05-08 ENCOUNTER — Ambulatory Visit (HOSPITAL_COMMUNITY)
Admission: RE | Admit: 2020-05-08 | Discharge: 2020-05-08 | Disposition: A | Discharge status: Home / Self Care | Payer: BC Managed Care – PPO | Source: Ambulatory Visit | Attending: Internal Medicine | Admitting: Internal Medicine

## 2020-05-08 ENCOUNTER — Encounter: Payer: Self-pay | Admitting: Internal Medicine

## 2020-05-08 VITALS — BP 143/88 | HR 85 | Resp 18 | Ht 65.0 in | Wt 201.1 lb

## 2020-05-08 DIAGNOSIS — M545 Low back pain, unspecified: Secondary | ICD-10-CM

## 2020-05-08 LAB — POCT URINALYSIS DIP (CLINITEK)
Bilirubin, UA: NEGATIVE
Glucose, UA: NEGATIVE mg/dL
Ketones, POC UA: NEGATIVE mg/dL
Leukocytes, UA: NEGATIVE
Nitrite, UA: NEGATIVE
POC PROTEIN,UA: NEGATIVE
Spec Grav, UA: 1.02 (ref 1.010–1.025)
Urobilinogen, UA: 0.2 E.U./dL
pH, UA: 6.5 (ref 5.0–8.0)

## 2020-05-08 MED ORDER — CYCLOBENZAPRINE HCL 5 MG PO TABS
5.0000 mg | ORAL_TABLET | Freq: Every evening | ORAL | 1 refills | Status: DC | PRN
Start: 2020-05-08 — End: 2021-08-11

## 2020-05-08 NOTE — Progress Notes (Signed)
Acute Office Visit  Subjective:    Patient ID: Alexa Savage, female    DOB: 1970-03-11, 50 y.o.   MRN: [id number]  Chief Complaint  Patient presents with  . Urinary Tract Infection    Possible uti having lower side pain and lower back pain has been going on since December 2021 side pain started yesterday     HPI Patient is in today for evaluation of lower back pain for about 2 months, which has been radiating to left side for last few days. Pain is constant, up to 8/10 at times, worse with movement and better with Tylenol/Ibuprofen. She denies any numbness, tingling or weakness in LE. Denies any recent injury. Denies lifting any heavy weight. No reports of rash in the area. Denies any urinary or stool incontinence. Denies any fever, chills, nausea, vomiting, constipation, diarrhea, dysuria or hematuria.  Past Medical History:  Diagnosis Date  . Allergy   . Anemia   . Bilateral lower extremity edema    occ  . Breast cyst    right  . Diabetes mellitus without complication (HCC)    borderline  . H/O hiatal hernia   . Headache    migraines  . Hypertension   . Sinusitis     Past Surgical History:  Procedure Laterality Date  . COLONOSCOPY N/A 05/24/2019   Procedure: COLONOSCOPY;  Surgeon: Danie Binder, MD;  [facility];  Service: Endoscopy;  Laterality: N/A;  1:00  . DILATATION & CURETTAGE/HYSTEROSCOPY WITH MYOSURE N/A 09/23/2016   Procedure: DILATATION & CURETTAGE/HYSTEROSCOPY WITH MYOSURE;  Surgeon: Servando Salina, MD;  Location: [facility];  Service: Gynecology;  Laterality: N/A;  . MYOMECTOMY N/A 11/26/2013   Procedure: Exploratory Laparotomy MYOMECTOMY;  Surgeon: Marvene Staff, MD;  Location: [facility];  Service: Gynecology;  Laterality: N/A;  . POLYPECTOMY  05/24/2019   Procedure: POLYPECTOMY;  Surgeon: Danie Binder, MD;  [facility];  Service: Endoscopy;;  cecal  . WISDOM TOOTH EXTRACTION      Family History  Problem Relation Age of  Onset  . Hypertension Mother   . Heart disease Father   . Cancer Maternal Aunt        Breast Cancer  . Hypertension Maternal Aunt   . Diabetes Maternal Grandmother   . Asthma Neg Hx     Social History   Socioeconomic History  . Marital status: Single    Spouse name: Not on file  . Number of children: Not on file  . Years of education: Not on file  . Highest education level: Not on file  Occupational History  . Not on file  Tobacco Use  . Smoking status: Never Smoker  . Smokeless tobacco: Never Used  Substance and Sexual Activity  . Alcohol use: No    Comment: rare  . Drug use: No  . Sexual activity: Not on file  Other Topics Concern  . Not on file  Social History Narrative  . Not on file   Social Determinants of Health   Financial Resource Strain: Not on file  Food Insecurity: Not on file  Transportation Needs: Not on file  Physical Activity: Not on file  Stress: Not on file  Social Connections: Not on file  Intimate Partner Violence: Not on file    Outpatient Medications Prior to Visit  Medication Sig Dispense Refill  . Biotin 5000 MCG TABS Take 5,000 tablets by mouth daily.     . ergocalciferol (VITAMIN D2) 1.25 MG (50000 UT) capsule  Take 1 capsule (50,000 Units total) by mouth once a week. One capsule once weekly 12 capsule 1  . hydrocortisone cream 1 % Apply 1 application topically 2 (two) times daily as needed for itching (hemmorroids.).    Marland Kitchen Multiple Vitamins-Minerals (MULTIVITAMIN WITH MINERALS) tablet Take 1 tablet by mouth daily.    . Olopatadine HCl (PAZEO) 0.7 % SOLN Place 1 drop into both eyes daily as needed (allergy eyes/irritation.).    Marland Kitchen triamterene-hydrochlorothiazide (MAXZIDE) 75-50 MG tablet Take 1 tablet by mouth daily. 90 tablet 1  . vitamin A 8000 UNIT capsule Take 8,000 Units by mouth daily.     No facility-administered medications prior to visit.    Allergies  Allergen Reactions  . Metronidazole Swelling  . Penicillins Hives and Rash     Has patient had a PCN reaction causing immediate rash, facial/tongue/throat swelling, SOB or lightheadedness with hypotension: Yes Has patient had a PCN reaction causing severe rash involving mucus membranes or skin necrosis: Yes Has patient had a PCN reaction that required hospitalization: No Has patient had a PCN reaction occurring within the last 10 years: No If all of the above answers are "NO", then may proceed with Cephalosporin use.     Review of Systems  Constitutional: Negative for chills and fever.  HENT: Negative for congestion, sinus pressure, sinus pain and sore throat.   Eyes: Negative for pain and discharge.  Respiratory: Negative for cough and shortness of breath.   Cardiovascular: Negative for chest pain and palpitations.  Gastrointestinal: Negative for abdominal pain, constipation, diarrhea, nausea and vomiting.  Endocrine: Negative for polydipsia and polyuria.  Genitourinary: Negative for dysuria and hematuria.  Musculoskeletal: Positive for back pain. Negative for neck pain and neck stiffness.  Skin: Negative for rash.  Neurological: Negative for dizziness and weakness.  Psychiatric/Behavioral: Negative for agitation and behavioral problems.       Objective:    Physical Exam Vitals reviewed.  Constitutional:      General: She is not in acute distress.    Appearance: She is not diaphoretic.  HENT:     Head: Normocephalic and atraumatic.     Nose: Nose normal.     Mouth/Throat:     Mouth: Mucous membranes are moist.  Eyes:     General: No scleral icterus.    Extraocular Movements: Extraocular movements intact.     Pupils: Pupils are equal, round, and reactive to light.  Cardiovascular:     Rate and Rhythm: Normal rate and regular rhythm.     Pulses: Normal pulses.     Heart sounds: No murmur heard.   Pulmonary:     Breath sounds: Normal breath sounds. No wheezing or rales.  Abdominal:     Palpations: Abdomen is soft.     Tenderness: There is no  abdominal tenderness.  Musculoskeletal:     Cervical back: Neck supple. No tenderness.     Lumbar back: Tenderness present.     Right lower leg: No edema.     Left lower leg: No edema.  Skin:    General: Skin is warm.     Findings: No rash.  Neurological:     General: No focal deficit present.     Mental Status: She is alert and oriented to person, place, and time.  Psychiatric:        Mood and Affect: Mood normal.        Behavior: Behavior normal.     BP (!) 143/88 (BP Location: Left Arm, Patient Position: Sitting,  Cuff Size: Normal)   Pulse 85   Resp 18   Ht 5\' 5"  (1.651 m)   Wt 201 lb 1.9 oz (91.2 kg)   SpO2 100%   BMI 33.47 kg/m  Wt Readings from Last 3 Encounters:  05/08/20 201 lb 1.9 oz (91.2 kg)  03/03/20 202 lb (91.6 kg)  12/12/19 203 lb 1.9 oz (92.1 kg)    There are no preventive care reminders to display for this patient.  There are no preventive care reminders to display for this patient.   Lab Results  Component Value Date   TSH 1.22 03/30/2019   Lab Results  Component Value Date   WBC 3.9 03/30/2019   HGB 13.8 03/30/2019   HCT 41.5 03/30/2019   MCV 87.7 03/30/2019   PLT 254 03/30/2019   Lab Results  Component Value Date   NA 143 12/07/2019   K 4.9 12/07/2019   CO2 23 12/07/2019   GLUCOSE 83 12/07/2019   BUN 12 12/07/2019   CREATININE 0.93 12/07/2019   BILITOT 0.6 03/30/2019   ALKPHOS 61 08/04/2015   AST 13 03/30/2019   ALT 13 03/30/2019   PROT 7.4 03/30/2019   ALBUMIN 3.9 08/04/2015   CALCIUM 9.7 12/07/2019   ANIONGAP 8 09/22/2016   Lab Results  Component Value Date   CHOL 183 03/30/2019   Lab Results  Component Value Date   HDL 65 03/30/2019   Lab Results  Component Value Date   LDLCALC 103 (H) 03/30/2019   Lab Results  Component Value Date   TRIG 61 03/30/2019   Lab Results  Component Value Date   CHOLHDL 2.8 03/30/2019   Lab Results  Component Value Date   HGBA1C 5.5 03/30/2019       Assessment & Plan:    Problem List Items Addressed This Visit   None   Visit Diagnoses    Low back pain, unspecified back pain laterality, unspecified chronicity, unspecified whether sciatica present    -  Primary No urinary or GI complaints UA negative for LE and nitrite Midline tenderness in the lumbar spine area, likely MSK in etiology Tylenol/Ibuprofen PRN Flexeril PRN Heating pads and simple back exercises, material provided Check X-ray lumbar spine   Relevant Medications   cyclobenzaprine (FLEXERIL) 5 MG tablet   Other Relevant Orders   POCT URINALYSIS DIP (CLINITEK) (Completed)   DG Lumbar Spine Complete       Meds ordered this encounter  Medications  . cyclobenzaprine (FLEXERIL) 5 MG tablet    Sig: Take 1 tablet (5 mg total) by mouth at bedtime as needed for muscle spasms.    Dispense:  30 tablet    Refill:  1     Babyboy Loya Keith Rake, MD

## 2020-05-08 NOTE — Patient Instructions (Signed)

## 2020-05-12 ENCOUNTER — Encounter: Payer: Self-pay | Admitting: Internal Medicine

## 2020-05-19 ENCOUNTER — Telehealth: Payer: Self-pay

## 2020-05-19 NOTE — Telephone Encounter (Signed)
Patient came by office today and followed up with this bill for 175. DOS 03/03/20 billed for 99442 175.00.  I called [employer] and they said they do not cover 9944_ and this should be billed as a office visit with the correct modifier.  I have re billed the claim today and it should pay with patient owing copay.  I did check the web site for [place] and is does indicate phones visits are covered till 05/14/20.  LMOM to patient I have re billed the claim.  I talked with Robin at [facility] and was given the info to re bill.

## 2020-08-04 ENCOUNTER — Ambulatory Visit: Payer: BC Managed Care – PPO | Admitting: Family Medicine

## 2020-08-04 ENCOUNTER — Encounter: Payer: Self-pay | Admitting: Family Medicine

## 2020-08-04 ENCOUNTER — Other Ambulatory Visit: Payer: Self-pay

## 2020-08-04 VITALS — BP 131/87 | HR 87 | Temp 98.3°F | Resp 20 | Ht 65.0 in | Wt 208.0 lb

## 2020-08-04 DIAGNOSIS — R319 Hematuria, unspecified: Secondary | ICD-10-CM | POA: Diagnosis not present

## 2020-08-04 DIAGNOSIS — R109 Unspecified abdominal pain: Secondary | ICD-10-CM | POA: Diagnosis not present

## 2020-08-04 DIAGNOSIS — E669 Obesity, unspecified: Secondary | ICD-10-CM | POA: Diagnosis not present

## 2020-08-04 DIAGNOSIS — I10 Essential (primary) hypertension: Secondary | ICD-10-CM

## 2020-08-04 DIAGNOSIS — E66812 Obesity, class 2: Secondary | ICD-10-CM

## 2020-08-04 DIAGNOSIS — Z23 Encounter for immunization: Secondary | ICD-10-CM | POA: Diagnosis not present

## 2020-08-04 DIAGNOSIS — E559 Vitamin D deficiency, unspecified: Secondary | ICD-10-CM

## 2020-08-04 DIAGNOSIS — R7303 Prediabetes: Secondary | ICD-10-CM

## 2020-08-04 DIAGNOSIS — R7301 Impaired fasting glucose: Secondary | ICD-10-CM

## 2020-08-04 DIAGNOSIS — Z13220 Encounter for screening for lipoid disorders: Secondary | ICD-10-CM

## 2020-08-04 DIAGNOSIS — R5383 Other fatigue: Secondary | ICD-10-CM

## 2020-08-04 MED ORDER — PHENTERMINE HCL 37.5 MG PO TABS: ORAL_TABLET | ORAL | 1 refills | Status: DC

## 2020-08-04 NOTE — Patient Instructions (Addendum)
F/u in 10 to 11 weeks, re evaluate weight and blood pressure, call if you need me sooner  Pneumonia vaccine (13 in office today)  Please get Covid booster at your pharmacy In  next 2 to 4 weeks   CCUA in office today to see if blood still present   Weight loss goal of 6 to 12 pounds, new is phentermine , half tablet once daily  Glad that back pain is less frequent, will order imaging study for your kidneys once the urine test is in   Please do back exercises more regularly, the X ray  of your back is essentially normal, just a slight amount of excess curvature  As soon as possible, please get fasting lipid, cmp and EGfR, CBC, tSH and vit D  It is important that you exercise regularly at least 30 minutes 5 times a week. If you develop chest pain, have severe difficulty breathing, or feel very tired, stop exercising immediately and seek medical attention

## 2020-08-04 NOTE — Progress Notes (Signed)
   Alexa Savage     MRN: [id number]      DOB: 09/20/1970   HPI Alexa Savage is here for follow up and re-evaluation of chronic medical conditions, medication management and review of any available recent lab and radiology data.  Preventive health is updated, specifically  Cancer screening and Immunization.   Still having intermittent back and flank pain up to an 8, and no speicific aggravating factor. Has had painless hematueria and is hypertensive Increased exercise commitment also has been watching diet more closely  ROS Denies recent fever or chills. Denies sinus pressure, nasal congestion, ear pain or sore throat. Denies chest congestion, productive cough or wheezing. Denies chest pains, palpitations and leg swelling Denies abdominal pain, nausea, vomiting,diarrhea or constipation.   Denies dysuria, frequency, hesitancy or incontinence. . Denies headaches, seizures, numbness, or tingling. Denies depression, anxiety or insomnia. Denies skin break down or rash.   PE  BP 131/87 (BP Location: Right Arm, Patient Position: Sitting, Cuff Size: Large)   Pulse 87   Temp 98.3 F (36.8 C)   Resp 20   Ht 5\' 5"  (1.651 m)   Wt 208 lb (94.3 kg)   SpO2 97%   BMI 34.61 kg/m   Patient alert and oriented and in no cardiopulmonary distress.  HEENT: No facial asymmetry, EOMI,     Neck supple .  Chest: Clear to auscultation bilaterally.  CVS: S1, S2 no murmurs, no S3.Regular rate.  ABD: Soft non tender. No renal angle tenderness  Ext: No edema  MS: Adequate ROM spine, shoulders, hips and knees.  Skin: Intact, no ulcerations or rash noted.  Psych: Good eye contact, normal affect. Memory intact not anxious or depressed appearing.  CNS: CN 2-12 intact, power,  normal throughout.no focal deficits noted.   Assessment & Plan  HTN (hypertension) Controlled, no change in medication DASH diet and commitment to daily physical activity for a minimum of 30 minutes discussed and  encouraged, as a part of hypertension management. The importance of attaining a healthy weight is also discussed.  BP/Weight 08/04/2020 05/08/2020 03/03/2020 12/12/2019 10/31/2019 3/50/0938 02/23/2991  Systolic BP 716 967 893 810 175 102 585  Diastolic BP 87 88 58 277 97 99 81  Wt. (Lbs) 208 201.12 202 203.12 202 201 207  BMI 34.61 33.47 33.61 33.8 33.61 33.45 34.45       Hematuria Persistent, rept UA abnormal, will obtain US and refer to Urology, has intermittent back pain  Obesity, Class II, BMI 35-39.9  Patient re-educated about  the importance of commitment to a  minimum of 150 minutes of exercise per week as able.  The importance of healthy food choices with portion control discussed, as well as eating regularly and within a 12 hour window most days. The need to choose "clean , green" food 50 to 75% of the time is discussed, as well as to make water the primary drink and set a goal of 64 ounces water daily.    Weight /BMI 08/04/2020 05/08/2020 03/03/2020  WEIGHT 208 lb 201 lb 1.9 oz 202 lb  HEIGHT 5\' 5"  5\' 5"  5\' 5"   BMI 34.61 kg/m2 33.47 kg/m2 33.61 kg/m2

## 2020-08-06 DIAGNOSIS — R319 Hematuria, unspecified: Secondary | ICD-10-CM | POA: Insufficient documentation

## 2020-08-06 LAB — POCT URINALYSIS DIPSTICK
Bilirubin, UA: NEGATIVE
Glucose, UA: NEGATIVE
Ketones, UA: NEGATIVE
Leukocytes, UA: NEGATIVE
Nitrite, UA: NEGATIVE
Protein, UA: NEGATIVE
Spec Grav, UA: 1.025 (ref 1.010–1.025)
Urobilinogen, UA: 1 E.U./dL
pH, UA: 6 (ref 5.0–8.0)

## 2020-08-07 ENCOUNTER — Other Ambulatory Visit: Payer: Self-pay | Admitting: Family Medicine

## 2020-08-07 LAB — CMP14+EGFR
ALT: 14 IU/L (ref 0–32)
AST: 19 IU/L (ref 0–40)
Albumin/Globulin Ratio: 2 (ref 1.2–2.2)
Albumin: 4.8 g/dL (ref 3.8–4.9)
Alkaline Phosphatase: 90 IU/L (ref 44–121)
BUN/Creatinine Ratio: 20 (ref 9–23)
BUN: 19 mg/dL (ref 6–24)
Bilirubin Total: 0.5 mg/dL (ref 0.0–1.2)
CO2: 22 mmol/L (ref 20–29)
Calcium: 9.8 mg/dL (ref 8.7–10.2)
Chloride: 99 mmol/L (ref 96–106)
Creatinine, Ser: 0.97 mg/dL (ref 0.57–1.00)
Globulin, Total: 2.4 g/dL (ref 1.5–4.5)
Glucose: 94 mg/dL (ref 65–99)
Potassium: 3.8 mmol/L (ref 3.5–5.2)
Sodium: 138 mmol/L (ref 134–144)
Total Protein: 7.2 g/dL (ref 6.0–8.5)
eGFR: 71 mL/min/{1.73_m2} (ref >59)

## 2020-08-07 LAB — CBC WITH DIFFERENTIAL/PLATELET
Basophils Absolute: 0 10*3/uL (ref 0.0–0.2)
Basos: 1 %
EOS (ABSOLUTE): 0 10*3/uL (ref 0.0–0.4)
Eos: 1 %
Hematocrit: 37.2 % (ref 34.0–46.6)
Hemoglobin: 12.6 g/dL (ref 11.1–15.9)
Immature Grans (Abs): 0 10*3/uL (ref 0.0–0.1)
Immature Granulocytes: 0 %
Lymphocytes Absolute: 1.6 10*3/uL (ref 0.7–3.1)
Lymphs: 33 %
MCH: 28.6 pg (ref 26.6–33.0)
MCHC: 33.9 g/dL (ref 31.5–35.7)
MCV: 85 fL (ref 79–97)
Monocytes Absolute: 0.5 10*3/uL (ref 0.1–0.9)
Monocytes: 10 %
Neutrophils Absolute: 2.8 10*3/uL (ref 1.4–7.0)
Neutrophils: 55 %
Platelets: 251 10*3/uL (ref 150–450)
RBC: 4.4 x10E6/uL (ref 3.77–5.28)
RDW: 12.2 % (ref 11.7–15.4)
WBC: 5 10*3/uL (ref 3.4–10.8)

## 2020-08-07 LAB — VITAMIN D 25 HYDROXY (VIT D DEFICIENCY, FRACTURES): Vit D, 25-Hydroxy: 41.4 ng/mL (ref 30.0–100.0)

## 2020-08-07 LAB — LIPID PANEL
Chol/HDL Ratio: 2.6 ratio (ref 0.0–4.4)
Cholesterol, Total: 187 mg/dL (ref 100–199)
HDL: 72 mg/dL (ref >39)
LDL Chol Calc (NIH): 102 mg/dL — ABNORMAL HIGH (ref 0–99)
Triglycerides: 68 mg/dL (ref 0–149)
VLDL Cholesterol Cal: 13 mg/dL (ref 5–40)

## 2020-08-07 LAB — TSH: TSH: 1.84 u[IU]/mL (ref 0.450–4.500)

## 2020-08-08 ENCOUNTER — Encounter: Payer: Self-pay | Admitting: Family Medicine

## 2020-08-08 NOTE — Assessment & Plan Note (Signed)
Controlled, no change in medication DASH diet and commitment to daily physical activity for a minimum of 30 minutes discussed and encouraged, as a part of hypertension management. The importance of attaining a healthy weight is also discussed.  BP/Weight 08/04/2020 05/08/2020 03/03/2020 12/12/2019 10/31/2019 08/12/3149 08/20/1605  Systolic BP 371 062 694 854 627 035 009  Diastolic BP 87 88 58 381 97 99 81  Wt. (Lbs) 208 201.12 202 203.12 202 201 207  BMI 34.61 33.47 33.61 33.8 33.61 33.45 34.45

## 2020-08-08 NOTE — Assessment & Plan Note (Signed)
  Patient re-educated about  the importance of commitment to a  minimum of 150 minutes of exercise per week as able.  The importance of healthy food choices with portion control discussed, as well as eating regularly and within a 12 hour window most days. The need to choose "clean , green" food 50 to 75% of the time is discussed, as well as to make water the primary drink and set a goal of 64 ounces water daily.    Weight /BMI 08/04/2020 05/08/2020 03/03/2020  WEIGHT 208 lb 201 lb 1.9 oz 202 lb  HEIGHT 5\' 5"  5\' 5"  5\' 5"   BMI 34.61 kg/m2 33.47 kg/m2 33.61 kg/m2

## 2020-08-08 NOTE — Assessment & Plan Note (Signed)
Persistent, rept UA abnormal, will obtain US and refer to Urology, has intermittent back pain

## 2020-08-11 ENCOUNTER — Other Ambulatory Visit: Payer: Self-pay

## 2020-08-11 ENCOUNTER — Ambulatory Visit (HOSPITAL_COMMUNITY)
Admission: RE | Admit: 2020-08-11 | Discharge: 2020-08-11 | Disposition: A | Discharge status: Home / Self Care | Payer: BC Managed Care – PPO | Source: Ambulatory Visit | Attending: Family Medicine | Admitting: Family Medicine

## 2020-08-11 DIAGNOSIS — R319 Hematuria, unspecified: Secondary | ICD-10-CM | POA: Diagnosis present

## 2020-08-11 DIAGNOSIS — I10 Essential (primary) hypertension: Secondary | ICD-10-CM | POA: Diagnosis present

## 2020-08-11 DIAGNOSIS — R109 Unspecified abdominal pain: Secondary | ICD-10-CM

## 2020-08-12 ENCOUNTER — Encounter: Payer: Self-pay | Admitting: Family Medicine

## 2020-08-12 ENCOUNTER — Other Ambulatory Visit: Payer: Self-pay | Admitting: Family Medicine

## 2020-08-12 DIAGNOSIS — R3121 Asymptomatic microscopic hematuria: Secondary | ICD-10-CM

## 2020-10-13 ENCOUNTER — Ambulatory Visit: Payer: BC Managed Care – PPO | Admitting: Family Medicine

## 2020-10-13 NOTE — Progress Notes (Signed)
H&P  Chief Complaint: Abnormal urinalysis  History of Present Illness: Alexa Savage is a 50 y.o. year old female referred by Dr. Moshe Cipro for dipstick positive hematuria on urinalyses.  In March and June, 2022, urinalyses revealed a small and moderate presence of hemoglobin on dipstick urinalyses.  Microscopic not performed.  Her back pain, also present, started about 2 months ago.  It is in the lower back, radiates to both sides.  Once was associated with nausea and vomiting.  No anterior radiation.  No gross hematuria.  No frequent urinary tract infections or prior visits to a urologist.  Past Medical History:  Diagnosis Date   Allergy    Anemia    Bilateral lower extremity edema    occ   Breast cyst    right   Diabetes mellitus without complication ([place])    borderline   H/O hiatal hernia    Headache    migraines   Hypertension    Sinusitis     Past Surgical History:  Procedure Laterality Date   COLONOSCOPY N/A 05/24/2019   Procedure: COLONOSCOPY;  Surgeon: Danie Binder, MD;  [facility];  Service: Endoscopy;  Laterality: N/A;  1:00   DILATATION & CURETTAGE/HYSTEROSCOPY WITH MYOSURE N/A 09/23/2016   Procedure: [place];  Surgeon: Servando Salina, MD;  Location: [facility];  Service: Gynecology;  Laterality: N/A;   MYOMECTOMY N/A 11/26/2013   Procedure: Exploratory Laparotomy MYOMECTOMY;  Surgeon: Marvene Staff, MD;  Location: [facility];  Service: Gynecology;  Laterality: N/A;   POLYPECTOMY  05/24/2019   Procedure: POLYPECTOMY;  Surgeon: Danie Binder, MD;  [facility];  Service: Endoscopy;;  cecal   WISDOM TOOTH EXTRACTION      Home Medications:  (Not in a hospital admission)   Allergies:  Allergies  Allergen Reactions   Metronidazole Swelling   Penicillins Hives and Rash    Has patient had a PCN reaction causing immediate rash, facial/tongue/throat swelling, SOB or lightheadedness with  hypotension: Yes Has patient had a PCN reaction causing severe rash involving mucus membranes or skin necrosis: Yes Has patient had a PCN reaction that required hospitalization: No Has patient had a PCN reaction occurring within the last 10 years: No If all of the above answers are "NO", then may proceed with Cephalosporin use.     Family History  Problem Relation Age of Onset   Hypertension Mother    Heart disease Father    Cancer Maternal Aunt        Breast Cancer   Hypertension Maternal Aunt    Diabetes Maternal Grandmother    Asthma Neg Hx     Social History:  reports that she has never smoked. She has never used smokeless tobacco. She reports that she does not drink alcohol and does not use drugs.  ROS: A complete review of systems was performed.  All systems are negative except for pertinent findings as noted.  Physical Exam:  Vital signs in last 24 hours: '@VSRANGES'$ @ General:  Alert and oriented, No acute distress HEENT: Normocephalic, atraumatic Neck: No JVD or lymphadenopathy Cardiovascular: Regular rate  Lungs: Normal inspiratory/expiratory excursion Abdomen: Soft, nontender, nondistended, no abdominal masses. Back: No CVA tenderness Extremities: No edema Neurologic: Grossly intact  I have reviewed prior pt notes  I have reviewed notes from referring/previous physicians  I have reviewed urinalysis results   Renal ultrasound images from 6.27.2022 reviewed: FINDINGS: Right Kidney:  Renal measurements: 10.8 x 4.4 x 5.8 cm =  volume: 144 mL. Echogenicity within normal limits. No mass or hydronephrosis visualized. No visible shadowing calculi.  Left Kidney:  Renal measurements: 12.7 x 5.8 x 5.6 cm = volume: 214 mL. Echogenicity within normal limits. No mass or hydronephrosis visualized. No visible shadowing calculi.  Bladder:  Appears normal for degree of bladder distention.  Other:  None.  IMPRESSION: Small right kidney compared to left.  Otherwise unremarkable renal ultrasound.   Impression/Assessment:  1.  Microscopic hematuria noted today and on prior urinalyses  2.  Back pain, probably not GU related  Plan:  I think it is best to perform CT abdomen and pelvis with and without contrast followed by cystoscopy here.  I discussed this with her as well as possible etiologies for the hematuria.  We will proceed with the above  Jorja Loa 10/13/2020, 7:47 PM  Lillette Boxer. Dalayza Zambrana MD

## 2020-10-14 ENCOUNTER — Ambulatory Visit: Payer: BC Managed Care – PPO | Admitting: Family Medicine

## 2020-10-14 ENCOUNTER — Ambulatory Visit: Payer: BC Managed Care – PPO | Admitting: Urology

## 2020-10-14 ENCOUNTER — Encounter: Payer: Self-pay | Admitting: Urology

## 2020-10-14 ENCOUNTER — Other Ambulatory Visit: Payer: Self-pay

## 2020-10-14 VITALS — BP 137/86 | HR 87

## 2020-10-14 DIAGNOSIS — R319 Hematuria, unspecified: Secondary | ICD-10-CM | POA: Diagnosis not present

## 2020-10-14 LAB — MICROSCOPIC EXAMINATION: Renal Epithel, UA: NONE SEEN /hpf

## 2020-10-14 LAB — URINALYSIS, ROUTINE W REFLEX MICROSCOPIC
Bilirubin, UA: NEGATIVE
Glucose, UA: NEGATIVE
Ketones, UA: NEGATIVE
Leukocytes,UA: NEGATIVE
Nitrite, UA: NEGATIVE
Protein,UA: NEGATIVE
Specific Gravity, UA: >1.03 — ABNORMAL HIGH (ref 1.005–1.030)
Urobilinogen, Ur: 0.2 mg/dL (ref 0.2–1.0)
pH, UA: 5 (ref 5.0–7.5)

## 2020-10-14 NOTE — Progress Notes (Signed)
Urological Symptom Review  Patient is experiencing the following symptoms: Hard to postpone urination Blood in urine Vaginal bleeding (female only)   Review of Systems  Gastrointestinal (upper)  : Negative for upper GI symptoms  Gastrointestinal (lower) : Negative for lower GI symptoms  Constitutional : Night Sweats  Skin: Negative for skin symptoms  Eyes: Blurred vision  Ear/Nose/Throat : Sinus problems  Hematologic/Lymphatic: Easy bruising  Cardiovascular : Negative for cardiovascular symptoms  Respiratory : Negative for respiratory symptoms  Endocrine: Negative for endocrine symptoms  Musculoskeletal: Back pain  Neurological: Headaches  Psychologic: Negative for psychiatric symptoms

## 2020-11-26 ENCOUNTER — Encounter: Payer: Self-pay | Admitting: Family Medicine

## 2020-11-26 ENCOUNTER — Ambulatory Visit: Payer: BC Managed Care – PPO | Admitting: Family Medicine

## 2020-11-26 ENCOUNTER — Other Ambulatory Visit: Payer: Self-pay

## 2020-11-26 VITALS — BP 148/102 | HR 77 | Resp 16 | Ht 65.0 in | Wt 215.0 lb

## 2020-11-26 DIAGNOSIS — I10 Essential (primary) hypertension: Secondary | ICD-10-CM

## 2020-11-26 DIAGNOSIS — Z23 Encounter for immunization: Secondary | ICD-10-CM | POA: Diagnosis not present

## 2020-11-26 DIAGNOSIS — R319 Hematuria, unspecified: Secondary | ICD-10-CM

## 2020-11-26 DIAGNOSIS — R0789 Other chest pain: Secondary | ICD-10-CM

## 2020-11-26 DIAGNOSIS — R0683 Snoring: Secondary | ICD-10-CM

## 2020-11-26 DIAGNOSIS — R079 Chest pain, unspecified: Secondary | ICD-10-CM | POA: Insufficient documentation

## 2020-11-26 DIAGNOSIS — E669 Obesity, unspecified: Secondary | ICD-10-CM

## 2020-11-26 MED ORDER — AMLODIPINE BESYLATE 2.5 MG PO TABS: 2.5000 mg | ORAL_TABLET | Freq: Every day | ORAL | 3 refills | Status: DC

## 2020-11-26 NOTE — Patient Instructions (Addendum)
Follow-up in mid to early July and early to mid January call if you need me sooner.  New additional medicine for blood pressure amlodipine 2.5 mg 1 daily.  Continue triamterene as before.  EKG in office today.  You are  referred to cardiology for evaluation of chest pain  Flu vaccine in office today.  Please work on changing your snacks from sugar 20 sweet snacks and increase vegetable and fruit.  Please work on stopping eating within a 12hour.  Please work on drinking 64 ounces of water daily.  You are referred for sleep study because of habitual snoring with hypertension.please schedule appointment for Alexa Savage at checkout if able  Thanks for choosing [facility], we consider it a privelige to serve you.

## 2020-11-26 NOTE — Progress Notes (Signed)
Ine 2.5   Alexa Savage     MRN: [id number]      DOB: 30-Aug-1970   HPI Alexa Savage is here for follow up and re-evaluation of chronic medical conditions, medication management and review of any available recent lab and radiology data.  Preventive health is updated, specifically  Cancer screening and Immunization.   The PT denies any adverse reactions to current medications since the last visit.  Has upcoming urologic procedure next week re  hematuria work up States noted bP high this am at home though generally it is normal/ good No regular exercise, inconsistently takes phentermine, eats too may sweets, ongoing weight gain, will continue to work on  this, a lot of eating is stress eating 2 day h/o substernal chest pain, non radating, no associated nausea or light headedness, has been helping move wood , but states pain started before this  ROS Denies recent fever or chills. Denies sinus pressure, nasal congestion, ear pain or sore throat. Denies chest congestion, productive cough or wheezing. Denies , palpitations and leg swelling Denies abdominal pain, nausea, vomiting,diarrhea or constipation.   Denies dysuria, frequency, heDenies depression, anxiety or insomnia. Denies skin break down or rash.   PE BP (!) 148/102   Pulse 77   Resp 16   Ht 5\' 5"  (1.651 m)   Wt 215 lb (97.5 kg)   SpO2 97%   BMI 35.78 kg/m   Patient alert and oriented and in no cardiopulmonary distress.  HEENT: No facial asymmetry, EOMI,     Neck supple .  Chest: Clear to auscultation bilaterally. EKG: nSR, no lVH, noischemia CVS: S1, S2 no murmurs, no S3.Regular rate.  ABD: Soft non tender.   Ext: No edema  MS: Adequate ROM spine, shoulders, hips and knees.  Skin: Intact, no ulcerations or rash noted.  Psych: Good eye contact, normal affect. Memory intact not anxious or depressed appearing.  CNS: CN 2-12 intact, power,  normal throughout.no focal deficits noted.   Assessment & Plan  HTN  (hypertension) Uncontrolled additional med added DASH diet and commitment to daily physical activity for a minimum of 30 minutes discussed and encouraged, as a part of hypertension management. The importance of attaining a healthy weight is also discussed.  BP/Weight 11/26/2020 10/14/2020 08/04/2020 05/08/2020 03/03/2020 12/12/2019 6/38/9373  Systolic BP 428 768 115 726 203 559 741  Diastolic BP 638 86 87 88 58 102 97  Wt. (Lbs) 215 - 208 201.12 202 203.12 202  BMI 35.78 - 34.61 33.47 33.61 33.8 33.61       Chest pain Non specific b history, however due to risk factors for heart disease and uncontrolled hTN , EKG obtained, shows no abnormality. Referred to cardiology for furhter eval as deemed necessary  Obesity, Class II, BMI 35-39.9  Patient re-educated about  the importance of commitment to a  minimum of 150 minutes of exercise per week as able.  The importance of healthy food choices with portion control discussed, as well as eating regularly and within a 12 hour window most days. The need to choose "clean , green" food 50 to 75% of the time is discussed, as well as to make water the primary drink and set a goal of 64 ounces water daily.    Weight /BMI 11/26/2020 08/04/2020 05/08/2020  WEIGHT 215 lb 208 lb 201 lb 1.9 oz  HEIGHT 5\' 5"  5\' 5"  5\' 5"   BMI 35.78 kg/m2 34.61 kg/m2 33.47 kg/m2   '    Hematuria Being investigated  by Urology, has procedure next week  Habitual snoring Reports habitual snoring , ahs hTN, thick neck and obesity, efer for eval for sleep apnea

## 2020-11-27 ENCOUNTER — Encounter: Payer: Self-pay | Admitting: Family Medicine

## 2020-11-27 DIAGNOSIS — R0683 Snoring: Secondary | ICD-10-CM | POA: Insufficient documentation

## 2020-11-27 NOTE — Assessment & Plan Note (Signed)
Uncontrolled additional med added DASH diet and commitment to daily physical activity for a minimum of 30 minutes discussed and encouraged, as a part of hypertension management. The importance of attaining a healthy weight is also discussed.  BP/Weight 11/26/2020 10/14/2020 08/04/2020 05/08/2020 03/03/2020 12/12/2019 07/04/8020  Systolic BP 336 122 449 753 005 110 211  Diastolic BP 173 86 87 88 58 102 97  Wt. (Lbs) 215 - 208 201.12 202 203.12 202  BMI 35.78 - 34.61 33.47 33.61 33.8 33.61

## 2020-11-27 NOTE — Assessment & Plan Note (Signed)
Being investigated by Urology, has procedure next week

## 2020-11-27 NOTE — Assessment & Plan Note (Signed)
  Patient re-educated about  the importance of commitment to a  minimum of 150 minutes of exercise per week as able.  The importance of healthy food choices with portion control discussed, as well as eating regularly and within a 12 hour window most days. The need to choose "clean , green" food 50 to 75% of the time is discussed, as well as to make water the primary drink and set a goal of 64 ounces water daily.    Weight /BMI 11/26/2020 08/04/2020 05/08/2020  WEIGHT 215 lb 208 lb 201 lb 1.9 oz  HEIGHT 5\' 5"  5\' 5"  5\' 5"   BMI 35.78 kg/m2 34.61 kg/m2 33.47 kg/m2   '

## 2020-11-27 NOTE — Assessment & Plan Note (Signed)
Non specific b history, however due to risk factors for heart disease and uncontrolled hTN , EKG obtained, shows no abnormality. Referred to cardiology for furhter eval as deemed necessary

## 2020-11-27 NOTE — Assessment & Plan Note (Signed)
Reports habitual snoring , ahs hTN, thick neck and obesity, efer for eval for sleep apnea

## 2020-12-02 ENCOUNTER — Other Ambulatory Visit: Payer: BC Managed Care – PPO | Admitting: Urology

## 2020-12-02 ENCOUNTER — Ambulatory Visit (HOSPITAL_COMMUNITY)
Admission: RE | Admit: 2020-12-02 | Discharge: 2020-12-02 | Disposition: A | Discharge status: Home / Self Care | Payer: BC Managed Care – PPO | Source: Ambulatory Visit | Attending: Urology | Admitting: Urology

## 2020-12-02 ENCOUNTER — Institutional Professional Consult (permissible substitution): Payer: BC Managed Care – PPO | Admitting: Pulmonary Disease

## 2020-12-02 ENCOUNTER — Other Ambulatory Visit: Payer: Self-pay

## 2020-12-02 DIAGNOSIS — R319 Hematuria, unspecified: Secondary | ICD-10-CM | POA: Insufficient documentation

## 2020-12-02 LAB — POCT I-STAT CREATININE: Creatinine, Ser: 1 mg/dL (ref 0.44–1.00)

## 2020-12-02 MED ORDER — IOHEXOL 350 MG/ML SOLN
100.0000 mL | Freq: Once | INTRAVENOUS | Status: AC | PRN
  Administered 2020-12-02: 100 mL via INTRAVENOUS

## 2020-12-09 ENCOUNTER — Other Ambulatory Visit: Payer: Self-pay

## 2020-12-09 ENCOUNTER — Encounter: Payer: Self-pay | Admitting: Urology

## 2020-12-09 ENCOUNTER — Ambulatory Visit (INDEPENDENT_AMBULATORY_CARE_PROVIDER_SITE_OTHER): Payer: BC Managed Care – PPO | Admitting: Urology

## 2020-12-09 VITALS — BP 143/87 | HR 85 | Temp 98.8°F

## 2020-12-09 DIAGNOSIS — R319 Hematuria, unspecified: Secondary | ICD-10-CM | POA: Diagnosis not present

## 2020-12-09 MED ORDER — CIPROFLOXACIN HCL 500 MG PO TABS
500.0000 mg | ORAL_TABLET | Freq: Once | ORAL | Status: AC
  Administered 2020-12-09: 500 mg via ORAL

## 2020-12-09 NOTE — Progress Notes (Signed)
Urological Symptom Review  Patient is experiencing the following symptoms: Get up at night to urinate Blood in urine   Review of Systems  Gastrointestinal (upper)  : Negative for upper GI symptoms  Gastrointestinal (lower) : Negative for lower GI symptoms  Constitutional : Night Sweats  Skin: Negative for skin symptoms  Eyes: Negative for eye symptoms  Ear/Nose/Throat : Negative for Ear/Nose/Throat symptoms  Hematologic/Lymphatic: Negative for Hematologic/Lymphatic symptoms  Cardiovascular : Negative for cardiovascular symptoms  Respiratory : Negative for respiratory symptoms  Endocrine: Negative for endocrine symptoms  Musculoskeletal: Back pain  Neurological: Headaches  Psychologic: Negative for psychiatric symptoms

## 2020-12-09 NOTE — Progress Notes (Signed)
History of Present Illness: Alexa Savage is a 50 y.o. year old female returns for completion of hematuria evaluation.  She has a history of microscopic hematuria.  CT abdomen and pelvis/hematuria protocol performed on 12/02/2020. No CT findings of the abdomen or pelvis to explain hematuria. No evidence of urinary tract calculus, hydronephrosis, or urinary tract filling defect on delayed phase imaging.  10.25.2022: Here for cystoscopy.  Past Medical History:  Diagnosis Date   Allergy    Anemia    Bilateral lower extremity edema    occ   Breast cyst    right   Diabetes mellitus without complication (HCC)    borderline   H/O hiatal hernia    Headache    migraines   Hypertension    Sinusitis     Past Surgical History:  Procedure Laterality Date   COLONOSCOPY N/A 05/24/2019   Procedure: COLONOSCOPY;  Surgeon: Danie Binder, MD;  [facility];  Service: Endoscopy;  Laterality: N/A;  1:00   DILATATION & CURETTAGE/HYSTEROSCOPY WITH MYOSURE N/A 09/23/2016   Procedure: [place];  Surgeon: Servando Salina, MD;  Location: [facility];  Service: Gynecology;  Laterality: N/A;   MYOMECTOMY N/A 11/26/2013   Procedure: Exploratory Laparotomy MYOMECTOMY;  Surgeon: Marvene Staff, MD;  Location: [facility];  Service: Gynecology;  Laterality: N/A;   POLYPECTOMY  05/24/2019   Procedure: POLYPECTOMY;  Surgeon: Danie Binder, MD;  Location: [facility];  Service: Endoscopy;;  cecal   WISDOM TOOTH EXTRACTION      Home Medications:  (Not in a hospital admission)   Allergies:  Allergies  Allergen Reactions   Metronidazole Swelling   Penicillins Hives and Rash    Has patient had a PCN reaction causing immediate rash, facial/tongue/throat swelling, SOB or lightheadedness with hypotension: Yes Has patient had a PCN reaction causing severe rash involving mucus membranes or skin necrosis: Yes Has patient had a PCN reaction that required  hospitalization: No Has patient had a PCN reaction occurring within the last 10 years: No If all of the above answers are "NO", then may proceed with Cephalosporin use.     Family History  Problem Relation Age of Onset   Hypertension Mother    Heart disease Father    Cancer Maternal Aunt        Breast Cancer   Hypertension Maternal Aunt    Diabetes Maternal Grandmother    Asthma Neg Hx     Social History:  reports that she has never smoked. She has never used smokeless tobacco. She reports that she does not drink alcohol and does not use drugs.  ROS: A complete review of systems was p laparoscopic hematuria erformed.  All systems are negative except for pertinent findings as noted.  Physical Exam:  Vital signs in last 24 hours: @VSRANGES @ General:  Alert and oriented, No acute distress HEENT: Normocephalic, atraumatic Neck: No JVD or lymphadenopathy Cardiovascular: Regular rate  Lungs: Normal inspiratory/expiratory excursion Abdomen: Soft, nontender, nondistended, no abdominal masses Back: No CVA tenderness Extremities: No edema Neurologic: Grossly intact Cystoscopy Procedure Note:  Indication: Hematuria  After informed consent and discussion of the procedure and its risks, Alexa Savage was positioned and prepped in the standard fashion.  Cystoscopy was performed with a flexible cystoscope.   Findings: Urethra: No lesions  Ureteral orifices: Normal in their configuration and location Bladder: No urothelial lesions or foreign bodies  The patient tolerated the procedure well.   I independently reviewed CT scan  images  Urinalysis was reviewed  Past records were reviewed  Impression/Assessment:  Hematuria with negative evaluation  Plan:  Office visit as needed  Jorja Loa 12/09/2020, 12:12 PM  Lillette Boxer. Arvis Zwahlen MD

## 2020-12-10 LAB — MICROSCOPIC EXAMINATION
Renal Epithel, UA: NONE SEEN /hpf
WBC, UA: NONE SEEN /hpf (ref 0–5)

## 2020-12-10 LAB — URINALYSIS, ROUTINE W REFLEX MICROSCOPIC
Bilirubin, UA: NEGATIVE
Glucose, UA: NEGATIVE
Ketones, UA: NEGATIVE
Leukocytes,UA: NEGATIVE
Nitrite, UA: NEGATIVE
Protein,UA: NEGATIVE
Specific Gravity, UA: >1.03 — ABNORMAL HIGH (ref 1.005–1.030)
Urobilinogen, Ur: 0.2 mg/dL (ref 0.2–1.0)
pH, UA: 5.5 (ref 5.0–7.5)

## 2020-12-13 ENCOUNTER — Encounter: Payer: Self-pay | Admitting: Urology

## 2021-01-27 ENCOUNTER — Ambulatory Visit: Payer: BC Managed Care – PPO | Admitting: Family Medicine

## 2021-01-27 ENCOUNTER — Other Ambulatory Visit: Payer: Self-pay | Admitting: Family Medicine

## 2021-01-27 DIAGNOSIS — Z1231 Encounter for screening mammogram for malignant neoplasm of breast: Secondary | ICD-10-CM

## 2021-02-25 ENCOUNTER — Other Ambulatory Visit: Payer: Self-pay

## 2021-02-25 ENCOUNTER — Ambulatory Visit: Payer: BC Managed Care – PPO | Admitting: Family Medicine

## 2021-02-25 ENCOUNTER — Encounter: Payer: Self-pay | Admitting: Family Medicine

## 2021-02-25 VITALS — BP 132/92 | HR 87 | Resp 15 | Ht 65.0 in | Wt 212.4 lb

## 2021-02-25 DIAGNOSIS — I10 Essential (primary) hypertension: Secondary | ICD-10-CM | POA: Diagnosis not present

## 2021-02-25 DIAGNOSIS — E669 Obesity, unspecified: Secondary | ICD-10-CM | POA: Diagnosis not present

## 2021-02-25 MED ORDER — WEGOVY 0.25 MG/0.5ML ~~LOC~~ SOAJ: 0.2500 mg | SUBCUTANEOUS | 1 refills | Status: DC

## 2021-02-25 NOTE — Patient Instructions (Signed)
F/U in 7 weeks, call if you need me sooner  No new med for blood pressure  New for weight loss is weekly weygova, let me know if not covered  Increase vegetables  Cut back on sweets and sodas please   It is important that you exercise regularly at least 30 minutes 5 times a week. If you develop chest pain, have severe difficulty breathing, or feel very tired, stop exercising immediately and seek medical attention

## 2021-03-01 ENCOUNTER — Encounter: Payer: Self-pay | Admitting: Family Medicine

## 2021-03-01 NOTE — Progress Notes (Signed)
° °  Alexa Savage     MRN: [id number]      DOB: August 22, 1970   HPI Alexa Savage is here for follow up and re-evaluation of chronic medical conditions, medication management and review of any available recent lab and radiology data.  Preventive health is updated, specifically  Cancer screening and Immunization.   Questions or concerns regarding consultations or procedures which the PT has had in the interim are  addressed. The PT denies any adverse reactions to current medications since the last visit.  There are no new concerns.  There are no specific complaints   ROS Denies recent fever or chills. Denies sinus pressure, nasal congestion, ear pain or sore throat. Denies chest congestion, productive cough or wheezing. Denies chest pains, palpitations and leg swelling Denies abdominal pain, nausea, vomiting,diarrhea or constipation.   Denies dysuria, frequency, hesitancy or incontinence. Denies joint pain, swelling and limitation in mobility. Denies headaches, seizures, numbness, or tingling. Denies depression, anxiety or insomnia. Denies skin break down or rash.   PE  BP (!) 132/92    Pulse 87    Resp 15    Ht 5\' 5"  (1.651 m)    Wt 212 lb 6.4 oz (96.3 kg)    SpO2 95%    BMI 35.35 kg/m   Patient alert and oriented and in no cardiopulmonary distress.  HEENT: No facial asymmetry, EOMI,     Neck supple .  Chest: Clear to auscultation bilaterally.  CVS: S1, S2 no murmurs, no S3.Regular rate.  ABD: Soft non tender.   Ext: No edema  MS: Adequate ROM spine, shoulders, hips and knees.  Skin: Intact, no ulcerations or rash noted.  Psych: Good eye contact, normal affect. Memory intact not anxious or depressed appearing.  CNS: CN 2-12 intact, power,  normal throughout.no focal deficits noted.   Assessment & Plan  HTN (hypertension) Not at goal, diastolic elevated DASH diet and commitment to daily physical activity for a minimum of 30 minutes discussed and encouraged, as a  part of hypertension management. The importance of attaining a healthy weight is also discussed.  BP/Weight 02/25/2021 12/09/2020 11/26/2020 10/14/2020 08/04/2020 05/08/2020 2/33/6122  Systolic BP 449 753 005 110 211 173 567  Diastolic BP 92 87 014 86 87 88 58  Wt. (Lbs) 212.4 - 215 - 208 201.12 202  BMI 35.35 - 35.78 - 34.61 33.47 33.61      No med change at this time  Obesity, Class II, BMI 35-39.9  Patient re-educated about  the importance of commitment to a  minimum of 150 minutes of exercise per week as able.  The importance of healthy food choices with portion control discussed, as well as eating regularly and within a 12 hour window most days. The need to choose "clean , green" food 50 to 75% of the time is discussed, as well as to make water the primary drink and set a goal of 64 ounces water daily.    Weight /BMI 02/25/2021 11/26/2020 08/04/2020  WEIGHT 212 lb 6.4 oz 215 lb 208 lb  HEIGHT 5\' 5"  5\' 5"  5\' 5"   BMI 35.35 kg/m2 35.78 kg/m2 34.61 kg/m2  wegovy precribed

## 2021-03-01 NOTE — Assessment & Plan Note (Signed)
°  Patient re-educated about  the importance of commitment to a  minimum of 150 minutes of exercise per week as able.  The importance of healthy food choices with portion control discussed, as well as eating regularly and within a 12 hour window most days. The need to choose "clean , green" food 50 to 75% of the time is discussed, as well as to make water the primary drink and set a goal of 64 ounces water daily.    Weight /BMI 02/25/2021 11/26/2020 08/04/2020  WEIGHT 212 lb 6.4 oz 215 lb 208 lb  HEIGHT 5\' 5"  5\' 5"  5\' 5"   BMI 35.35 kg/m2 35.78 kg/m2 34.61 kg/m2  wegovy precribed

## 2021-03-01 NOTE — Assessment & Plan Note (Signed)
Not at goal, diastolic elevated DASH diet and commitment to daily physical activity for a minimum of 30 minutes discussed and encouraged, as a part of hypertension management. The importance of attaining a healthy weight is also discussed.  BP/Weight 02/25/2021 12/09/2020 11/26/2020 10/14/2020 08/04/2020 05/08/2020 2/42/6834  Systolic BP 196 222 979 892 119 417 408  Diastolic BP 92 87 144 86 87 88 58  Wt. (Lbs) 212.4 - 215 - 208 201.12 202  BMI 35.35 - 35.78 - 34.61 33.47 33.61      No med change at this time

## 2021-03-13 ENCOUNTER — Ambulatory Visit
Admission: RE | Admit: 2021-03-13 | Discharge: 2021-03-13 | Disposition: A | Discharge status: Home / Self Care | Payer: BC Managed Care – PPO | Source: Ambulatory Visit | Attending: Family Medicine | Admitting: Family Medicine

## 2021-03-13 DIAGNOSIS — Z1231 Encounter for screening mammogram for malignant neoplasm of breast: Secondary | ICD-10-CM

## 2021-03-16 ENCOUNTER — Other Ambulatory Visit: Payer: Self-pay | Admitting: Family Medicine

## 2021-03-16 DIAGNOSIS — R928 Other abnormal and inconclusive findings on diagnostic imaging of breast: Secondary | ICD-10-CM

## 2021-03-18 ENCOUNTER — Ambulatory Visit
Admission: RE | Admit: 2021-03-18 | Discharge: 2021-03-18 | Disposition: A | Discharge status: Home / Self Care | Payer: BC Managed Care – PPO | Source: Ambulatory Visit | Attending: Family Medicine | Admitting: Family Medicine

## 2021-03-18 DIAGNOSIS — R928 Other abnormal and inconclusive findings on diagnostic imaging of breast: Secondary | ICD-10-CM

## 2021-04-07 ENCOUNTER — Other Ambulatory Visit: Payer: Self-pay | Admitting: *Deleted

## 2021-04-07 ENCOUNTER — Other Ambulatory Visit: Payer: Self-pay

## 2021-04-07 ENCOUNTER — Encounter: Payer: Self-pay | Admitting: Family Medicine

## 2021-04-07 ENCOUNTER — Encounter: Payer: Self-pay | Admitting: Internal Medicine

## 2021-04-07 ENCOUNTER — Ambulatory Visit: Payer: BC Managed Care – PPO | Admitting: Internal Medicine

## 2021-04-07 DIAGNOSIS — J019 Acute sinusitis, unspecified: Secondary | ICD-10-CM | POA: Diagnosis not present

## 2021-04-07 DIAGNOSIS — J0110 Acute frontal sinusitis, unspecified: Secondary | ICD-10-CM

## 2021-04-07 DIAGNOSIS — D2261 Melanocytic nevi of right upper limb, including shoulder: Secondary | ICD-10-CM | POA: Insufficient documentation

## 2021-04-07 MED ORDER — AZITHROMYCIN 250 MG PO TABS
ORAL_TABLET | ORAL | 0 refills | Status: AC
Start: 2021-04-07 — End: 2021-04-12

## 2021-04-07 MED ORDER — TRIAMTERENE-HCTZ 75-50 MG PO TABS: 1.0000 | ORAL_TABLET | Freq: Every day | ORAL | 1 refills | Status: DC

## 2021-04-07 NOTE — Progress Notes (Signed)
Virtual Visit via Telephone Note   This visit type was conducted due to national recommendations for restrictions regarding the COVID-19 Pandemic (e.g. social distancing) in an effort to limit this patient's exposure and mitigate transmission in our community.  Due to her co-morbid illnesses, this patient is at least at moderate risk for complications without adequate follow up.  This format is felt to be most appropriate for this patient at this time.  The patient did not have access to video technology/had technical difficulties with video requiring transitioning to audio format only (telephone).  All issues noted in this document were discussed and addressed.  No physical exam could be performed with this format.  Evaluation Performed:  Follow-up visit  Date:  04/07/2021   ID:  Genesi, Stefanko 1970-09-16, MRN [id number]  Patient Location: Home Provider Location: Office/Clinic  Participants: Patient Location of Patient: Home Location of Provider: Telehealth Consent was obtain for visit to be over via telehealth. I verified that I am speaking with the correct person using two identifiers.  PCP:  Fayrene Helper, MD   Chief Complaint: Nasal congestion and cough  History of Present Illness:    Alexa Savage is a 51 y.o. female who has a televisit for complaint of nasal congestion, cough, postnasal drip, fatigue and myalgias for the last 5 days.  She denies any fever or chills currently.  Denies any dyspnea or wheezing currently.  Her home COVID test was negative on 02/18.  The patient does not have symptoms concerning for COVID-19 infection (fever, chills, cough, or new shortness of breath).   Past Medical, Surgical, Social History, Allergies, and Medications have been Reviewed.  Past Medical History:  Diagnosis Date   Allergy    Anemia    Bilateral lower extremity edema    occ   Breast cyst    right   Diabetes mellitus without complication (HCC)     borderline   H/O hiatal hernia    Headache    migraines   Hypertension    Sinusitis    Past Surgical History:  Procedure Laterality Date   COLONOSCOPY N/A 05/24/2019   Procedure: COLONOSCOPY;  Surgeon: Danie Binder, MD;  [facility];  Service: Endoscopy;  Laterality: N/A;  1:00   DILATATION & CURETTAGE/HYSTEROSCOPY WITH MYOSURE N/A 09/23/2016   Procedure: [place];  Surgeon: Servando Salina, MD;  Location: [facility];  Service: Gynecology;  Laterality: N/A;   MYOMECTOMY N/A 11/26/2013   Procedure: Exploratory Laparotomy MYOMECTOMY;  Surgeon: Marvene Staff, MD;  Location: [facility];  Service: Gynecology;  Laterality: N/A;   POLYPECTOMY  05/24/2019   Procedure: POLYPECTOMY;  Surgeon: Danie Binder, MD;  [facility];  Service: Endoscopy;;  cecal   WISDOM TOOTH EXTRACTION       Current Meds  Medication Sig   amLODipine (NORVASC) 2.5 MG tablet Take 1 tablet (2.5 mg total) by mouth daily.   Biotin 5000 MCG TABS Take 5,000 tablets by mouth daily.    cyclobenzaprine (FLEXERIL) 5 MG tablet Take 1 tablet (5 mg total) by mouth at bedtime as needed for muscle spasms.   Multiple Vitamins-Minerals (MULTIVITAMIN WITH MINERALS) tablet Take 1 tablet by mouth daily.   Olopatadine HCl (PAZEO) 0.7 % SOLN Place 1 drop into both eyes daily as needed (allergy eyes/irritation.).   vitamin A 8000 UNIT capsule Take 8,000 Units by mouth daily.   WEGOVY 0.25 MG/0.5ML SOAJ Inject 0.25 mg into the skin  once a week.   [DISCONTINUED] triamterene-hydrochlorothiazide (MAXZIDE) 75-50 MG tablet Take 1 tablet by mouth daily.     Allergies:   Metronidazole and Penicillins   ROS:   Please see the history of present illness.     All other systems reviewed and are negative.   Labs/Other Tests and Data Reviewed:    Recent Labs: 08/06/2020: ALT 14; BUN 19; Hemoglobin 12.6; Platelets 251; Potassium 3.8; Sodium 138; TSH 1.840 12/02/2020: Creatinine,  Ser 1.00   Recent Lipid Panel Lab Results  Component Value Date/Time   CHOL 187 08/06/2020 09:06 AM   TRIG 68 08/06/2020 09:06 AM   HDL 72 08/06/2020 09:06 AM   CHOLHDL 2.6 08/06/2020 09:06 AM   CHOLHDL 2.8 03/30/2019 01:15 PM   LDLCALC 102 (H) 08/06/2020 09:06 AM   LDLCALC 103 (H) 03/30/2019 01:15 PM    Wt Readings from Last 3 Encounters:  02/25/21 212 lb 6.4 oz (96.3 kg)  11/26/20 215 lb (97.5 kg)  08/04/20 208 lb (94.3 kg)     ASSESSMENT & PLAN:    Acute sinusitis Started empiric azithromycin as she has persistent symptoms despite symptomatic treatment Continue Mucinex DM for cough and congestion Advised to use nasal saline spray as needed  Time:   Today, I have spent 9 minutes reviewing the chart, including problem list, medications, and with the patient with telehealth technology discussing the above problems.   Medication Adjustments/Labs and Tests Ordered: Current medicines are reviewed at length with the patient today.  Concerns regarding medicines are outlined above.   Tests Ordered: No orders of the defined types were placed in this encounter.   Medication Changes: No orders of the defined types were placed in this encounter.    Note: This dictation was prepared with Dragon dictation along with smaller phrase technology. Similar sounding words can be transcribed inadequately or may not be corrected upon review. Any transcriptional errors that result from this process are unintentional.      Disposition:  Follow up  Signed, Lindell Spar, MD  04/07/2021 1:55 PM     [facility]

## 2021-04-07 NOTE — Telephone Encounter (Signed)
Appt scheduled

## 2021-04-10 ENCOUNTER — Other Ambulatory Visit: Payer: BC Managed Care – PPO

## 2021-04-24 ENCOUNTER — Ambulatory Visit: Payer: BC Managed Care – PPO | Admitting: Family Medicine

## 2021-05-07 ENCOUNTER — Ambulatory Visit: Payer: BC Managed Care – PPO | Admitting: Family Medicine

## 2021-05-18 ENCOUNTER — Telehealth: Payer: Self-pay

## 2021-05-18 NOTE — Telephone Encounter (Signed)
Patient called and said 04/07/21 visit dx is not being paid for phone visit.  Is it any other DX you could file for this?  Please let me know and I can call back this patient. ?

## 2021-05-20 ENCOUNTER — Ambulatory Visit: Payer: BC Managed Care – PPO | Admitting: Family Medicine

## 2021-05-26 NOTE — Telephone Encounter (Signed)
I Called patient to let her know DX was updated and this will re file - mail box was full and could not leave message ?

## 2021-06-23 ENCOUNTER — Ambulatory Visit: Payer: BC Managed Care – PPO | Admitting: Family Medicine

## 2021-06-23 ENCOUNTER — Encounter: Payer: Self-pay | Admitting: Family Medicine

## 2021-06-23 VITALS — BP 134/86 | HR 78 | Ht 65.0 in | Wt 216.1 lb

## 2021-06-23 DIAGNOSIS — R7303 Prediabetes: Secondary | ICD-10-CM | POA: Diagnosis not present

## 2021-06-23 DIAGNOSIS — E669 Obesity, unspecified: Secondary | ICD-10-CM | POA: Diagnosis not present

## 2021-06-23 DIAGNOSIS — E559 Vitamin D deficiency, unspecified: Secondary | ICD-10-CM

## 2021-06-23 DIAGNOSIS — N951 Menopausal and female climacteric states: Secondary | ICD-10-CM

## 2021-06-23 DIAGNOSIS — Z13220 Encounter for screening for lipoid disorders: Secondary | ICD-10-CM

## 2021-06-23 DIAGNOSIS — I10 Essential (primary) hypertension: Secondary | ICD-10-CM | POA: Diagnosis not present

## 2021-06-23 MED ORDER — AMLODIPINE BESYLATE 2.5 MG PO TABS: 2.5000 mg | ORAL_TABLET | Freq: Every day | ORAL | 3 refills | Status: DC

## 2021-06-23 MED ORDER — WEGOVY 0.5 MG/0.5ML ~~LOC~~ SOAJ: 0.5000 mg | SUBCUTANEOUS | 1 refills | Status: DC

## 2021-06-23 NOTE — Assessment & Plan Note (Signed)
?  Patient re-educated about  the importance of commitment to a  minimum of 150 minutes of exercise per week as able. ? ?The importance of healthy food choices with portion control discussed, as well as eating regularly and within a 12 hour window most days. ?The need to choose "clean , green" food 50 to 75% of the time is discussed, as well as to make water the primary drink and set a goal of 64 ounces water daily. ? ?  ? ?  06/23/2021  ?  3:57 PM 02/25/2021  ?  4:28 PM 11/26/2020  ?  2:44 PM  ?Weight /BMI  ?Weight 216 lb 1.3 oz 212 lb 6.4 oz 215 lb  ?Height '5\' 5"'$  (1.651 m) '5\' 5"'$  (1.651 m) '5\' 5"'$  (1.651 m)  ?BMI 35.96 kg/m2 35.35 kg/m2 35.78 kg/m2  ?start weekly wegovy ? ? ?

## 2021-06-23 NOTE — Assessment & Plan Note (Signed)
Perimenopausal, educated re natural approach to menopause, and option of SSRI or SNRI. Will take non pharmacologic approach at this time ?

## 2021-06-23 NOTE — Patient Instructions (Signed)
F/U in 7 weeks, call if you  need me sooner ? ?Fasting lipid, cmp and EGFr, CBC, TSH , Vit D  and HBA1C 1 week before follow up ? ?Start wegovy weekly, and continue BP meds ? ?It is important that you exercise regularly at least 30 minutes 5 times a week. If you develop chest pain, have severe difficulty breathing, or feel very tired, stop exercising immediately and seek medical attention  ? ?Stop easting at 7 pm ? ?Drink 64 ounces water daily ? ?Eat mainly vegetables, fruit , protein, beans and eggs ? ?Thanks for choosing [facility], we consider it a privelige to serve you. ? ? ? ?

## 2021-06-23 NOTE — Progress Notes (Signed)
? ?Alexa Savage     MRN: [id number]      DOB: [birth year] ? ? ?HPI ?Alexa Savage is here for follow up and re-evaluation of chronic medical conditions, medication management and review of any available recent lab and radiology data.  ?Preventive health is updated, specifically  Cancer screening and Immunization.   ?Questions or concerns regarding consultations or procedures which the PT has had in the interim are  addressed. ?The PT denies any adverse reactions to current medications since the last visit.  ?Removed tick around 6 weeks ago, area still dark and nodular, no constitutional symptoms ?C/o hot flashe  ? ?ROS ?Denies recent fever or chills. ?Denies sinus pressure, nasal congestion, ear pain or sore throat. ?Denies chest congestion, productive cough or wheezing. ?Denies chest pains, palpitations and leg swelling ?Denies abdominal pain, nausea, vomiting,diarrhea or constipation.   ?Denies dysuria, frequency, hesitancy or incontinence. ?Denies joint pain, swelling and limitation in mobility. ?Denies headaches, seizures, numbness, or tingling. ?Denies depression, anxiety or insomnia. ?Denies skin break down or rash. ? ? ?PE ? ?BP 134/86   Pulse 78   Ht '5\' 5"'$  (1.651 m)   Wt 216 lb 1.3 oz (98 kg)   SpO2 95%   BMI 35.96 kg/m?  ? ?Patient alert and oriented and in no cardiopulmonary distress. ? ?HEENT: No facial asymmetry, EOMI,     Neck supple . ? ?Chest: Clear to auscultation bilaterally. ? ?CVS: S1, S2 no murmurs, no S3.Regular rate. ? ?ABD: Soft non tender.  ? ?Ext: No edema ? ?MS: Adequate ROM spine, shoulders, hips and knees. ? ?Skin: Intact, no ulcerations or rash noted.hyperpigmented nodule in area where tick was removed ? ?Psych: Good eye contact, normal affect. Memory intact not anxious or depressed appearing. ? ?CNS: CN 2-12 intact, power,  normal throughout.no focal deficits noted. ? ? ?Assessment & Plan ? ?HTN (hypertension) ?Controlled, no change in medication ?DASH diet and commitment to  daily physical activity for a minimum of 30 minutes discussed and encouraged, as a part of hypertension management. ?The importance of attaining a healthy weight is also discussed. ? ? ?  06/23/2021  ?  3:57 PM 02/25/2021  ?  5:00 PM 02/25/2021  ?  4:31 PM 02/25/2021  ?  4:28 PM 12/09/2020  ?  3:37 PM 11/26/2020  ?  3:06 PM 11/26/2020  ?  2:44 PM  ?BP/Weight  ?Systolic BP 517 616 073 710 143 148 169  ?Diastolic BP 86 92 98 626 87 102 108  ?Wt. (Lbs) 216.08   212.4   215  ?BMI 35.96 kg/m2   35.35 kg/m2   35.78 kg/m2  ? ? ? ? ? ?Obesity, Class II, BMI 35-39.9 ? ?Patient re-educated about  the importance of commitment to a  minimum of 150 minutes of exercise per week as able. ? ?The importance of healthy food choices with portion control discussed, as well as eating regularly and within a 12 hour window most days. ?The need to choose "clean , green" food 50 to 75% of the time is discussed, as well as to make water the primary drink and set a goal of 64 ounces water daily. ? ?  ? ?  06/23/2021  ?  3:57 PM 02/25/2021  ?  4:28 PM 11/26/2020  ?  2:44 PM  ?Weight /BMI  ?Weight 216 lb 1.3 oz 212 lb 6.4 oz 215 lb  ?Height '5\' 5"'$  (1.651 m) '5\' 5"'$  (1.651 m) '5\' 5"'$  (1.651 m)  ?BMI 35.96 kg/m2 35.35 kg/m2  35.78 kg/m2  ?start weekly wegovy ? ? ? ?Hot flashes due to menopause ?Perimenopausal, educated re natural approach to menopause, and option of SSRI or SNRI. Will take non pharmacologic approach at this time ? ?

## 2021-06-23 NOTE — Assessment & Plan Note (Signed)
Controlled, no change in medication ?DASH diet and commitment to daily physical activity for a minimum of 30 minutes discussed and encouraged, as a part of hypertension management. ?The importance of attaining a healthy weight is also discussed. ? ? ?  06/23/2021  ?  3:57 PM 02/25/2021  ?  5:00 PM 02/25/2021  ?  4:31 PM 02/25/2021  ?  4:28 PM 12/09/2020  ?  3:37 PM 11/26/2020  ?  3:06 PM 11/26/2020  ?  2:44 PM  ?BP/Weight  ?Systolic BP 993 570 177 939 143 148 169  ?Diastolic BP 86 92 98 030 87 102 108  ?Wt. (Lbs) 216.08   212.4   215  ?BMI 35.96 kg/m2   35.35 kg/m2   35.78 kg/m2  ? ? ? ? ?

## 2021-07-09 ENCOUNTER — Telehealth: Payer: BC Managed Care – PPO | Admitting: Family Medicine

## 2021-07-09 DIAGNOSIS — J069 Acute upper respiratory infection, unspecified: Secondary | ICD-10-CM | POA: Diagnosis not present

## 2021-07-09 MED ORDER — BENZONATATE 100 MG PO CAPS: 100.0000 mg | ORAL_CAPSULE | Freq: Two times a day (BID) | ORAL | 0 refills | Status: DC | PRN

## 2021-07-09 MED ORDER — FLUTICASONE PROPIONATE 50 MCG/ACT NA SUSP: 2.0000 | Freq: Every day | NASAL | 0 refills | Status: DC

## 2021-07-09 NOTE — Progress Notes (Signed)

## 2021-08-11 ENCOUNTER — Ambulatory Visit: Payer: BC Managed Care – PPO | Admitting: Family Medicine

## 2021-08-11 ENCOUNTER — Encounter: Payer: Self-pay | Admitting: Family Medicine

## 2021-08-11 VITALS — BP 148/100 | HR 85 | Resp 16 | Ht 65.0 in | Wt 208.0 lb

## 2021-08-11 DIAGNOSIS — Z23 Encounter for immunization: Secondary | ICD-10-CM | POA: Diagnosis not present

## 2021-08-11 DIAGNOSIS — E669 Obesity, unspecified: Secondary | ICD-10-CM | POA: Diagnosis not present

## 2021-08-11 DIAGNOSIS — I10 Essential (primary) hypertension: Secondary | ICD-10-CM

## 2021-08-11 MED ORDER — DILTIAZEM HCL ER COATED BEADS 180 MG PO CP24: 180.0000 mg | ORAL_CAPSULE | Freq: Every day | ORAL | 3 refills | Status: DC

## 2021-08-12 LAB — CMP14+EGFR
ALT: 15 IU/L (ref 0–32)
AST: 15 IU/L (ref 0–40)
Albumin/Globulin Ratio: 1.8 (ref 1.2–2.2)
Albumin: 4.9 g/dL (ref 3.8–4.9)
Alkaline Phosphatase: 82 IU/L (ref 44–121)
BUN/Creatinine Ratio: 12 (ref 9–23)
BUN: 11 mg/dL (ref 6–24)
Bilirubin Total: 0.6 mg/dL (ref 0.0–1.2)
CO2: 23 mmol/L (ref 20–29)
Calcium: 10.5 mg/dL — ABNORMAL HIGH (ref 8.7–10.2)
Chloride: 102 mmol/L (ref 96–106)
Creatinine, Ser: 0.9 mg/dL (ref 0.57–1.00)
Globulin, Total: 2.7 g/dL (ref 1.5–4.5)
Glucose: 87 mg/dL (ref 70–99)
Potassium: 4.6 mmol/L (ref 3.5–5.2)
Sodium: 141 mmol/L (ref 134–144)
Total Protein: 7.6 g/dL (ref 6.0–8.5)
eGFR: 77 mL/min/{1.73_m2} (ref >59)

## 2021-08-12 LAB — HEMOGLOBIN A1C
Est. average glucose Bld gHb Est-mCnc: 117 mg/dL
Hgb A1c MFr Bld: 5.7 % — ABNORMAL HIGH (ref 4.8–5.6)

## 2021-08-12 LAB — CBC
Hematocrit: 43.6 % (ref 34.0–46.6)
Hemoglobin: 14.2 g/dL (ref 11.1–15.9)
MCH: 28.9 pg (ref 26.6–33.0)
MCHC: 32.6 g/dL (ref 31.5–35.7)
MCV: 89 fL (ref 79–97)
Platelets: 251 10*3/uL (ref 150–450)
RBC: 4.92 x10E6/uL (ref 3.77–5.28)
RDW: 12.8 % (ref 11.7–15.4)
WBC: 3.6 10*3/uL (ref 3.4–10.8)

## 2021-08-12 LAB — LIPID PANEL
Chol/HDL Ratio: 2.9 ratio (ref 0.0–4.4)
Cholesterol, Total: 207 mg/dL — ABNORMAL HIGH (ref 100–199)
HDL: 71 mg/dL (ref >39)
LDL Chol Calc (NIH): 125 mg/dL — ABNORMAL HIGH (ref 0–99)
Triglycerides: 64 mg/dL (ref 0–149)
VLDL Cholesterol Cal: 11 mg/dL (ref 5–40)

## 2021-08-12 LAB — VITAMIN D 25 HYDROXY (VIT D DEFICIENCY, FRACTURES): Vit D, 25-Hydroxy: 21.4 ng/mL — ABNORMAL LOW (ref 30.0–100.0)

## 2021-08-12 LAB — TSH: TSH: 1.65 u[IU]/mL (ref 0.450–4.500)

## 2021-09-23 ENCOUNTER — Encounter (INDEPENDENT_AMBULATORY_CARE_PROVIDER_SITE_OTHER): Payer: Self-pay

## 2021-10-06 ENCOUNTER — Ambulatory Visit: Payer: BC Managed Care – PPO | Admitting: Family Medicine

## 2021-10-22 ENCOUNTER — Ambulatory Visit: Payer: BC Managed Care – PPO | Admitting: Family Medicine

## 2021-10-22 ENCOUNTER — Encounter: Payer: Self-pay | Admitting: Family Medicine

## 2021-10-22 VITALS — BP 148/94 | HR 77 | Resp 16 | Ht 65.0 in | Wt 213.0 lb

## 2021-10-22 DIAGNOSIS — Z1231 Encounter for screening mammogram for malignant neoplasm of breast: Secondary | ICD-10-CM | POA: Diagnosis not present

## 2021-10-22 DIAGNOSIS — Z23 Encounter for immunization: Secondary | ICD-10-CM | POA: Diagnosis not present

## 2021-10-22 DIAGNOSIS — E669 Obesity, unspecified: Secondary | ICD-10-CM

## 2021-10-22 DIAGNOSIS — R7302 Impaired glucose tolerance (oral): Secondary | ICD-10-CM | POA: Insufficient documentation

## 2021-10-22 DIAGNOSIS — R7303 Prediabetes: Secondary | ICD-10-CM | POA: Insufficient documentation

## 2021-10-22 DIAGNOSIS — E559 Vitamin D deficiency, unspecified: Secondary | ICD-10-CM

## 2021-10-22 DIAGNOSIS — I10 Essential (primary) hypertension: Secondary | ICD-10-CM

## 2021-10-22 DIAGNOSIS — E785 Hyperlipidemia, unspecified: Secondary | ICD-10-CM | POA: Insufficient documentation

## 2021-10-22 NOTE — Assessment & Plan Note (Signed)
Patient educated about the importance of limiting  Carbohydrate intake , the need to commit to daily physical activity for a minimum of 30 minutes , and to commit weight loss. The fact that changes in all these areas will reduce or eliminate all together the development of diabetes is stressed.      Latest Ref Rng & Units 08/11/2021    4:43 PM 12/02/2020    9:13 AM 08/06/2020    9:06 AM 12/07/2019    8:04 AM 03/30/2019    1:15 PM  Diabetic Labs  HbA1c 4.8 - 5.6 % 5.7     5.5   Chol 100 - 199 mg/dL 207   187   183   HDL >39 mg/dL 71   72   65   Calc LDL 0 - 99 mg/dL 125   102   103   Triglycerides 0 - 149 mg/dL 64   68   61   Creatinine 0.57 - 1.00 mg/dL 0.90  1.00  0.97  0.93  0.77       10/22/2021    4:05 PM 10/22/2021    3:32 PM 08/11/2021    4:20 PM 08/11/2021    3:55 PM 06/23/2021    3:57 PM 02/25/2021    5:00 PM 02/25/2021    4:31 PM  BP/Weight  Systolic BP 410 301 314 388 875 797 282  Diastolic BP 94 93 060 95 86 92 98  Wt. (Lbs)  213  208 216.08    BMI  35.45 kg/m2  34.61 kg/m2 35.96 kg/m2         No data to display          Updated lab needed at/ before next visit.

## 2021-10-22 NOTE — Assessment & Plan Note (Signed)
Hyperlipidemia:Low fat diet discussed and encouraged.   Lipid Panel  Lab Results  Component Value Date   CHOL 207 (H) 08/11/2021   HDL 71 08/11/2021   LDLCALC 125 (H) 08/11/2021   TRIG 64 08/11/2021   CHOLHDL 2.9 08/11/2021   Updated lab needed at/ before next visit.

## 2021-10-22 NOTE — Assessment & Plan Note (Signed)
Needs weekly supplement

## 2021-10-22 NOTE — Patient Instructions (Addendum)
F/u first week in December re evaluate weight and blood pressure, call if you need me sooner  Flu vaccine today  Please schedule mammogram Feb 2 or after , breast center, late afternoon appointment at [place] to take BOTH blood pressure meds, cardizem and triamterene together at the same time every day, morning preferred  Fasting lipid, chem 7 and EGFR, hBA1C 5 days before December visit  It is important that you exercise regularly at least 30 minutes 5 times a week. If you develop chest pain, have severe difficulty breathing, or feel very tired, stop exercising immediately and seek medical attention   Continue to work on eating habits     Vit D was low, I recommend OTC vit D3 , 2000 IU daily  Thanks for choosing [facility], we consider it a privelige to serve you.

## 2021-10-22 NOTE — Assessment & Plan Note (Signed)
  Patient re-educated about  the importance of commitment to a  minimum of 150 minutes of exercise per week as able.  The importance of healthy food choices with portion control discussed, as well as eating regularly and within a 12 hour window most days. The need to choose "clean , green" food 50 to 75% of the time is discussed, as well as to make water the primary drink and set a goal of 64 ounces water daily.       10/22/2021    3:32 PM 08/11/2021    3:55 PM 06/23/2021    3:57 PM  Weight /BMI  Weight 213 lb 208 lb 216 lb 1.3 oz  Height '5\' 5"'$  (1.651 m) '5\' 5"'$  (1.651 m) '5\' 5"'$  (1.651 m)  BMI 35.45 kg/m2 34.61 kg/m2 35.96 kg/m2

## 2021-10-22 NOTE — Assessment & Plan Note (Signed)
Uncontrolled needs both medications for control states will take them DASH diet and commitment to daily physical activity for a minimum of 30 minutes discussed and encouraged, as a part of hypertension management. The importance of attaining a healthy weight is also discussed.     10/22/2021    4:05 PM 10/22/2021    3:32 PM 08/11/2021    4:20 PM 08/11/2021    3:55 PM 06/23/2021    3:57 PM 02/25/2021    5:00 PM 02/25/2021    4:31 PM  BP/Weight  Systolic BP 658 006 349 494 473 958 441  Diastolic BP 94 93 712 95 86 92 98  Wt. (Lbs)  213  208 216.08    BMI  35.45 kg/m2  34.61 kg/m2 35.96 kg/m2

## 2021-10-22 NOTE — Progress Notes (Signed)
Alexa Savage     MRN: [id number]      DOB: 02-11-1971   HPI Alexa Savage is here for follow up and re-evaluation of chronic medical conditions, medication management and review of any available recent lab and radiology data.  Preventive health is updated, specifically  Cancer screening and Immunization.   Questions or concerns regarding consultations or procedures which the PT has had in the interim are  addressed. The PT denies any adverse reactions to current medications since the last visit.  Inadvertently has not taken new higher dose of BP meds from last visit, states she misunderstood Has not been able to get wegovy, working on habits, weight is up and down   ROS Denies recent fever or chills. Denies sinus pressure, nasal congestion, ear pain or sore throat. Denies chest congestion, productive cough or wheezing. Denies chest pains, palpitations and leg swelling Denies abdominal pain, nausea, vomiting,diarrhea or constipation.   Denies dysuria, frequency, hesitancy or incontinence. Denies joint pain, swelling and limitation in mobility. Denies headaches, seizures, numbness, or tingling. Denies depression, anxiety or insomnia. Denies skin break down or rash.   PE  BP (!) 148/94   Pulse 77   Resp 16   Ht '5\' 5"'$  (1.651 m)   Wt 213 lb (96.6 kg)   SpO2 97%   BMI 35.45 kg/m   Patient alert and oriented and in no cardiopulmonary distress.  HEENT: No facial asymmetry, EOMI,     Neck supple .  Chest: Clear to auscultation bilaterally.  CVS: S1, S2 no murmurs, no S3.Regular rate.  ABD: Soft non tender.   Ext: No edema  MS: Adequate ROM spine, shoulders, hips and knees.  Skin: Intact, no ulcerations or rash noted.  Psych: Good eye contact, normal affect. Memory intact not anxious or depressed appearing.  CNS: CN 2-12 intact, power,  normal throughout.no focal deficits noted.   Assessment & Plan  HTN (hypertension) Uncontrolled needs both medications for  control states will take them DASH diet and commitment to daily physical activity for a minimum of 30 minutes discussed and encouraged, as a part of hypertension management. The importance of attaining a healthy weight is also discussed.     10/22/2021    4:05 PM 10/22/2021    3:32 PM 08/11/2021    4:20 PM 08/11/2021    3:55 PM 06/23/2021    3:57 PM 02/25/2021    5:00 PM 02/25/2021    4:31 PM  BP/Weight  Systolic BP 409 811 914 782 956 213 086  Diastolic BP 94 93 578 95 86 92 98  Wt. (Lbs)  213  208 216.08    BMI  35.45 kg/m2  34.61 kg/m2 35.96 kg/m2         Obesity, Class II, BMI 35-39.9  Patient re-educated about  the importance of commitment to a  minimum of 150 minutes of exercise per week as able.  The importance of healthy food choices with portion control discussed, as well as eating regularly and within a 12 hour window most days. The need to choose "clean , green" food 50 to 75% of the time is discussed, as well as to make water the primary drink and set a goal of 64 ounces water daily.       10/22/2021    3:32 PM 08/11/2021    3:55 PM 06/23/2021    3:57 PM  Weight /BMI  Weight 213 lb 208 lb 216 lb 1.3 oz  Height '5\' 5"'$  (1.651 m) 5'  5" (1.651 m) '5\' 5"'$  (1.651 m)  BMI 35.45 kg/m2 34.61 kg/m2 35.96 kg/m2      Hyperlipidemia LDL goal <100 Hyperlipidemia:Low fat diet discussed and encouraged.   Lipid Panel  Lab Results  Component Value Date   CHOL 207 (H) 08/11/2021   HDL 71 08/11/2021   LDLCALC 125 (H) 08/11/2021   TRIG 64 08/11/2021   CHOLHDL 2.9 08/11/2021   Updated lab needed at/ before next visit.     IGT (impaired glucose tolerance) Patient educated about the importance of limiting  Carbohydrate intake , the need to commit to daily physical activity for a minimum of 30 minutes , and to commit weight loss. The fact that changes in all these areas will reduce or eliminate all together the development of diabetes is stressed.      Latest Ref Rng & Units  08/11/2021    4:43 PM 12/02/2020    9:13 AM 08/06/2020    9:06 AM 12/07/2019    8:04 AM 03/30/2019    1:15 PM  Diabetic Labs  HbA1c 4.8 - 5.6 % 5.7     5.5   Chol 100 - 199 mg/dL 207   187   183   HDL >39 mg/dL 71   72   65   Calc LDL 0 - 99 mg/dL 125   102   103   Triglycerides 0 - 149 mg/dL 64   68   61   Creatinine 0.57 - 1.00 mg/dL 0.90  1.00  0.97  0.93  0.77       10/22/2021    4:05 PM 10/22/2021    3:32 PM 08/11/2021    4:20 PM 08/11/2021    3:55 PM 06/23/2021    3:57 PM 02/25/2021    5:00 PM 02/25/2021    4:31 PM  BP/Weight  Systolic BP 606 301 601 093 235 573 220  Diastolic BP 94 93 254 95 86 92 98  Wt. (Lbs)  213  208 216.08    BMI  35.45 kg/m2  34.61 kg/m2 35.96 kg/m2         No data to display          Updated lab needed at/ before next visit.   Vitamin D deficiency Needs weekly supplement

## 2021-11-19 ENCOUNTER — Telehealth: Payer: Self-pay | Admitting: Family Medicine

## 2021-11-19 NOTE — Telephone Encounter (Signed)
Alexa Savage with [employer] [phone]   Case Ref ID # [id number] stating that she is offering [place] a resubmission on the PA form for Semaglutide-Weight Management (WEGOVY) 0.5 MG/0.5ML SOAJ . Said to please reach out to her if you would like to proceed.

## 2021-11-23 NOTE — Telephone Encounter (Signed)
PA resubmitted 11/23/21

## 2022-01-21 ENCOUNTER — Ambulatory Visit: Payer: BC Managed Care – PPO | Admitting: Family Medicine

## 2022-01-29 ENCOUNTER — Other Ambulatory Visit: Payer: Self-pay | Admitting: Family Medicine

## 2022-02-10 ENCOUNTER — Ambulatory Visit: Payer: BC Managed Care – PPO | Admitting: Family Medicine

## 2022-02-10 ENCOUNTER — Encounter: Payer: Self-pay | Admitting: Family Medicine

## 2022-02-10 VITALS — BP 120/78 | HR 79 | Ht 65.0 in | Wt 209.1 lb

## 2022-02-10 DIAGNOSIS — I10 Essential (primary) hypertension: Secondary | ICD-10-CM | POA: Diagnosis not present

## 2022-02-10 DIAGNOSIS — R3121 Asymptomatic microscopic hematuria: Secondary | ICD-10-CM

## 2022-02-10 DIAGNOSIS — E669 Obesity, unspecified: Secondary | ICD-10-CM | POA: Diagnosis not present

## 2022-02-10 DIAGNOSIS — E66811 Obesity, class 1: Secondary | ICD-10-CM

## 2022-02-10 DIAGNOSIS — E559 Vitamin D deficiency, unspecified: Secondary | ICD-10-CM

## 2022-02-10 LAB — BMP8+EGFR
BUN/Creatinine Ratio: 17 (ref 9–23)
BUN: 15 mg/dL (ref 6–24)
CO2: 23 mmol/L (ref 20–29)
Calcium: 10 mg/dL (ref 8.7–10.2)
Chloride: 103 mmol/L (ref 96–106)
Creatinine, Ser: 0.88 mg/dL (ref 0.57–1.00)
Glucose: 87 mg/dL (ref 70–99)
Potassium: 4.1 mmol/L (ref 3.5–5.2)
Sodium: 142 mmol/L (ref 134–144)
eGFR: 79 mL/min/{1.73_m2} (ref >59)

## 2022-02-10 LAB — LIPID PANEL
Chol/HDL Ratio: 3.1 ratio (ref 0.0–4.4)
Cholesterol, Total: 226 mg/dL — ABNORMAL HIGH (ref 100–199)
HDL: 74 mg/dL (ref >39)
LDL Chol Calc (NIH): 141 mg/dL — ABNORMAL HIGH (ref 0–99)
Triglycerides: 66 mg/dL (ref 0–149)
VLDL Cholesterol Cal: 11 mg/dL (ref 5–40)

## 2022-02-10 LAB — HEMOGLOBIN A1C
Est. average glucose Bld gHb Est-mCnc: 126 mg/dL
Hgb A1c MFr Bld: 6 % — ABNORMAL HIGH (ref 4.8–5.6)

## 2022-02-10 MED ORDER — DILTIAZEM HCL ER COATED BEADS 180 MG PO CP24: 180.0000 mg | ORAL_CAPSULE | Freq: Every day | ORAL | 3 refills | Status: DC

## 2022-02-10 MED ORDER — TIRZEPATIDE-WEIGHT MANAGEMENT 5 MG/0.5ML ~~LOC~~ SOAJ: 5.0000 mg | SUBCUTANEOUS | 2 refills | Status: DC

## 2022-02-10 MED ORDER — TIRZEPATIDE-WEIGHT MANAGEMENT 2.5 MG/0.5ML ~~LOC~~ SOAJ: 2.5000 mg | SUBCUTANEOUS | 0 refills | Status: DC

## 2022-02-10 NOTE — Patient Instructions (Addendum)
F/U in 10 weeks, re evaluate weight, call if you need me sooner  Blood pressure ois controlled on current medication , PLEASE take every day  Updated Covid vaccine is recommended  New once weekly injection for weight loss, PLEASE send a message as to whether you have gotten it or any problems  you may have. Dose increases after 1 month  Weight loss goal of 4 to 6 poundsper month  It is important that you exercise regularly at least 30 minutes 5 times a week. If you develop chest pain, have severe difficulty breathing, or feel very tired, stop exercising immediately and seek medical attention  Please reduce fried and fatty foods and sugar and starches ( white foods) blood sugar has iincreased  BEST for 2024!  Thanks for choosing [facility], we consider it a privelige to serve you.

## 2022-02-11 ENCOUNTER — Telehealth: Payer: Self-pay | Admitting: Family Medicine

## 2022-02-11 ENCOUNTER — Encounter: Payer: Self-pay | Admitting: Family Medicine

## 2022-02-11 MED ORDER — WEGOVY 0.5 MG/0.5ML ~~LOC~~ SOAJ: 0.5000 mg | SUBCUTANEOUS | 2 refills | Status: DC

## 2022-02-11 MED ORDER — WEGOVY 0.25 MG/0.5ML ~~LOC~~ SOAJ: 0.2500 mg | SUBCUTANEOUS | 0 refills | Status: DC

## 2022-02-11 NOTE — Assessment & Plan Note (Signed)
Improved, she is applauded on his Mounjaro prescribed  Patient re-educated about  the importance of commitment to a  minimum of 150 minutes of exercise per week as able.  The importance of healthy food choices with portion control discussed, as well as eating regularly and within a 12 hour window most days. The need to choose "clean , green" food 50 to 75% of the time is discussed, as well as to make water the primary drink and set a goal of 64 ounces water daily.       02/10/2022    2:12 PM 10/22/2021    3:32 PM 08/11/2021    3:55 PM  Weight /BMI  Weight 209 lb 1.9 oz 213 lb 208 lb  Height '5\' 5"'$  (1.651 m) '5\' 5"'$  (1.651 m) '5\' 5"'$  (1.651 m)  BMI 34.8 kg/m2 35.45 kg/m2 34.61 kg/m2    F/u in 10 weeks

## 2022-02-11 NOTE — Assessment & Plan Note (Signed)
Controlled, no change in medication DASH diet and commitment to daily physical activity for a minimum of 30 minutes discussed and encouraged, as a part of hypertension management. The importance of attaining a healthy weight is also discussed.     02/10/2022    2:47 PM 02/10/2022    2:14 PM 02/10/2022    2:12 PM 10/22/2021    4:05 PM 10/22/2021    3:32 PM 08/11/2021    4:20 PM 08/11/2021    3:55 PM  BP/Weight  Systolic BP 706 237 628 315 176 160 737  Diastolic BP 78 78 93 94 93 100 95  Wt. (Lbs)   209.12  213  208  BMI   34.8 kg/m2  35.45 kg/m2  34.61 kg/m2

## 2022-02-11 NOTE — Assessment & Plan Note (Signed)
Updated lab needed at/ before next visit.   

## 2022-02-11 NOTE — Telephone Encounter (Signed)
I personally called the pt explained zepboumd is not covered and wegovy has been prescribed

## 2022-02-11 NOTE — Telephone Encounter (Signed)
LMTRC

## 2022-02-11 NOTE — Assessment & Plan Note (Signed)
Alexa Savage been referred to Urology in the past, unclear as to if she did go, will rept CCUA at next visit, or pre visit if has labs will add on

## 2022-02-11 NOTE — Progress Notes (Signed)
Alexa Savage     MRN: [id number]      DOB: 01/09/1971   HPI Alexa Savage is here for follow up and re-evaluation of chronic medical conditions, medication management and review of any available recent lab and radiology data.  Preventive health is updated, specifically  Cancer screening and Immunization.   The PT denies any adverse reactions to current medications since the last visit. Reprts "forgetting " to take BP med every day, takes on avg 5 days/ week There are no new concerns.  There are no specific complaints   ROS Denies recent fever or chills. Denies sinus pressure, nasal congestion, ear pain or sore throat. Denies chest congestion, productive cough or wheezing. Denies chest pains, palpitations and leg swelling Denies abdominal pain, nausea, vomiting,diarrhea or constipation.   Denies dysuria, frequency, hesitancy or incontinence. Denies joint pain, swelling and limitation in mobility. Denies headaches, seizures, numbness, or tingling. Denies depression, anxiety or insomnia. Denies skin break down or rash.   PE  BP 120/78   Pulse 79   Ht '5\' 5"'$  (1.651 m)   Wt 209 lb 1.9 oz (94.9 kg)   SpO2 96%   BMI 34.80 kg/m   Patient alert and oriented and in no cardiopulmonary distress.  HEENT: No facial asymmetry, EOMI,     Neck supple .  Chest: Clear to auscultation bilaterally.  CVS: S1, S2 no murmurs, no S3.Regular rate.  ABD: Soft non tender.   Ext: No edema  MS: Adequate ROM spine, shoulders, hips and knees.  Skin: Intact, no ulcerations or rash noted.  Psych: Good eye contact, normal affect. Memory intact not anxious or depressed appearing.  CNS: CN 2-12 intact, power,  normal throughout.no focal deficits noted.   Assessment & Plan  HTN (hypertension) Controlled, no change in medication DASH diet and commitment to daily physical activity for a minimum of 30 minutes discussed and encouraged, as a part of hypertension management. The importance of  attaining a healthy weight is also discussed.     02/10/2022    2:47 PM 02/10/2022    2:14 PM 02/10/2022    2:12 PM 10/22/2021    4:05 PM 10/22/2021    3:32 PM 08/11/2021    4:20 PM 08/11/2021    3:55 PM  BP/Weight  Systolic BP 938 182 993 716 967 893 810  Diastolic BP 78 78 93 94 93 100 95  Wt. (Lbs)   209.12  213  208  BMI   34.8 kg/m2  35.45 kg/m2  34.61 kg/m2       Obesity (BMI 30.0-34.9) Improved, she is applauded on his Mounjaro prescribed  Patient re-educated about  the importance of commitment to a  minimum of 150 minutes of exercise per week as able.  The importance of healthy food choices with portion control discussed, as well as eating regularly and within a 12 hour window most days. The need to choose "clean , green" food 50 to 75% of the time is discussed, as well as to make water the primary drink and set a goal of 64 ounces water daily.       02/10/2022    2:12 PM 10/22/2021    3:32 PM 08/11/2021    3:55 PM  Weight /BMI  Weight 209 lb 1.9 oz 213 lb 208 lb  Height '5\' 5"'$  (1.651 m) '5\' 5"'$  (1.651 m) '5\' 5"'$  (1.651 m)  BMI 34.8 kg/m2 35.45 kg/m2 34.61 kg/m2    F/u in 10 weeks  Vitamin D deficiency  Updated lab needed at/ before next visit.   Hematuria Hass been referred to Urology in the past, unclear as to if she did go, will rept CCUA at next visit, or pre visit if has labs will add on

## 2022-02-11 NOTE — Telephone Encounter (Signed)
Pls advise and order CCUA evaluate for hematuria, needs to  get it through [place] in next 1 to 2 weeks/ asap Saw urology 11/2020, needs to go back if her rept CCUA still has blood,thanks ?? Pls ask Thanks

## 2022-02-16 ENCOUNTER — Other Ambulatory Visit: Payer: Self-pay | Admitting: Family Medicine

## 2022-02-16 DIAGNOSIS — R3121 Asymptomatic microscopic hematuria: Secondary | ICD-10-CM

## 2022-02-16 NOTE — Telephone Encounter (Signed)
Test ordered and sent her a mychart message letting her know to do the urine test in 1-2 weeks.

## 2022-03-05 LAB — URINALYSIS, ROUTINE W REFLEX MICROSCOPIC
Bilirubin, UA: NEGATIVE
Glucose, UA: NEGATIVE
Ketones, UA: NEGATIVE
Leukocytes,UA: NEGATIVE
Nitrite, UA: NEGATIVE
RBC, UA: NEGATIVE
Urobilinogen, Ur: 0.2 mg/dL (ref 0.2–1.0)
pH, UA: 6 (ref 5.0–7.5)

## 2022-03-08 ENCOUNTER — Encounter: Payer: Self-pay | Admitting: Family Medicine

## 2022-03-09 ENCOUNTER — Telehealth: Payer: BC Managed Care – PPO | Admitting: Physician Assistant

## 2022-03-09 ENCOUNTER — Encounter: Payer: Self-pay | Admitting: Family Medicine

## 2022-03-09 DIAGNOSIS — U071 COVID-19: Secondary | ICD-10-CM

## 2022-03-09 MED ORDER — BENZONATATE 100 MG PO CAPS: 100.0000 mg | ORAL_CAPSULE | Freq: Three times a day (TID) | ORAL | 0 refills | Status: DC | PRN

## 2022-03-09 NOTE — Progress Notes (Signed)
E-Visit  for Positive Covid Test Result  We are sorry you are not feeling well. We are here to help!  You have tested positive for COVID-19, meaning that you were infected with the novel coronavirus and could give the virus to others.  It is vitally important that you stay home so you do not spread it to others.      Please continue isolation at home, for at least 10 days since the start of your symptoms and until you have had 24 hours with no fever (without taking a fever reducer) and with improving of symptoms.  If you have no symptoms but tested positive (or all symptoms resolve after 5 days and you have no fever) you can leave your house but continue to wear a mask around others for an additional 5 days. If you have a fever,continue to stay home until you have had 24 hours of no fever. Most cases improve 5-10 days from onset but we have seen a small number of patients who have gotten worse after the 10 days.  Please be sure to watch for worsening symptoms and remain taking the proper precautions.   Go to the nearest hospital ED for assessment if fever/cough/breathlessness are severe or illness seems like a threat to life.    The following symptoms may appear 2-14 days after exposure: Fever Cough Shortness of breath or difficulty breathing Chills Repeated shaking with chills Muscle pain Headache Sore throat New loss of taste or smell Fatigue Congestion or runny nose Nausea or vomiting Diarrhea  You have been enrolled in [facility] for COVID-19. Daily you will receive a questionnaire within the [facility] website. Our COVID-19 response team will be monitoring your responses daily.  You can use medication such as prescription cough medication called Tessalon Perles 100 mg. You may take 1-2 capsules every 8 hours as needed for cough  You may also take acetaminophen (Tylenol) as needed for fever.  I have sent a work note to Doctor, hospital. You can find by going to the Menu  on your homepage, scrolling down to the Communications section, and selecting Letters. Let us know if you have any issue locating. Take care and feel better soon!   HOME CARE: Only take medications as instructed by your medical team. Drink plenty of fluids and get plenty of rest. A steam or ultrasonic humidifier can help if you have congestion.   GET HELP RIGHT AWAY IF YOU HAVE EMERGENCY WARNING SIGNS.  Call 911 or proceed to your closest emergency facility if: You develop worsening high fever. Trouble breathing Bluish lips or face Persistent pain or pressure in the chest New confusion Inability to wake or stay awake You cough up blood. Your symptoms become more severe Inability to hold down food or fluids  This list is not all possible symptoms. Contact your medical provider for any symptoms that are severe or concerning to you.    Your e-visit answers were reviewed by a board certified advanced clinical practitioner to complete your personal care plan.  Depending on the condition, your plan could have included both over the counter or prescription medications.  If there is a problem please reply once you have received a response from your provider.  Your safety is important to [place].  If you have drug allergies check your prescription carefully.    You can use MyChart to ask questions about today's visit, request a non-urgent call back, or ask for a work or school excuse for  24 hours related to this e-Visit. If it has been greater than 24 hours you will need to follow up with your provider, or enter a new e-Visit to address those concerns. You will get an e-mail in the next two days asking about your experience.  I hope that your e-visit has been valuable and will speed your recovery. Thank you for using e-visits.

## 2022-03-09 NOTE — Progress Notes (Signed)
I have spent 5 minutes in review of e-visit questionnaire, review and updating patient chart, medical decision making and response to patient.   Echo Propp Cody Tiaunna Buford, PA-C    

## 2022-03-15 ENCOUNTER — Ambulatory Visit: Payer: BC Managed Care – PPO

## 2022-03-27 IMAGING — MG MM DIGITAL DIAGNOSTIC UNILAT*R* W/ TOMO W/ CAD
6 series · 6 of 18 positions shown · non-contrast
Comparison: Previous exam(s).

CLINICAL DATA: Recall from screening mammography, possible
asymmetry in the upper breast at anterior to middle depth visible on
MLO view.

EXAM:
DIGITAL DIAGNOSTIC UNILATERAL RIGHT MAMMOGRAM WITH TOMOSYNTHESIS AND
CAD; ULTRASOUND RIGHT BREAST LIMITED
TECHNIQUE: Right digital diagnostic mammography and breast tomosynthesis was
performed. The images were evaluated with computer-aided detection.;
Targeted ultrasound examination of the right breast was performed.

[R MLO synth-2D (1 of 2)]
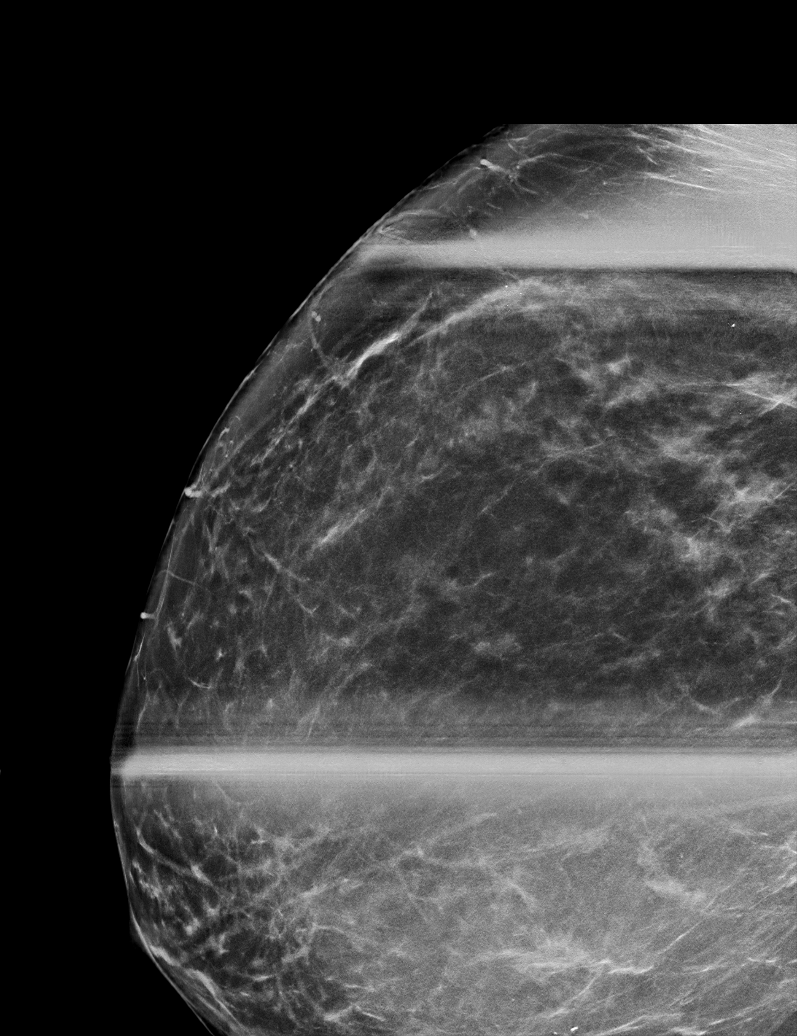

[R ML synth-2D]
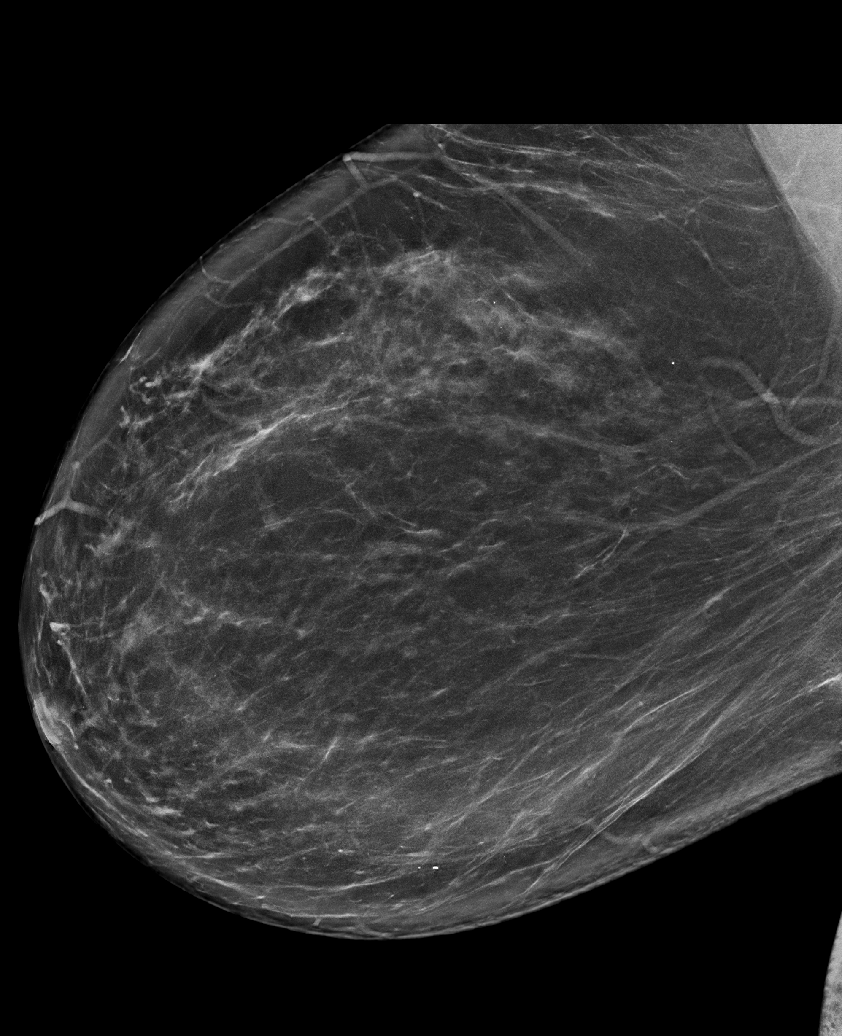

[R MLO synth-2D (2 of 2)]
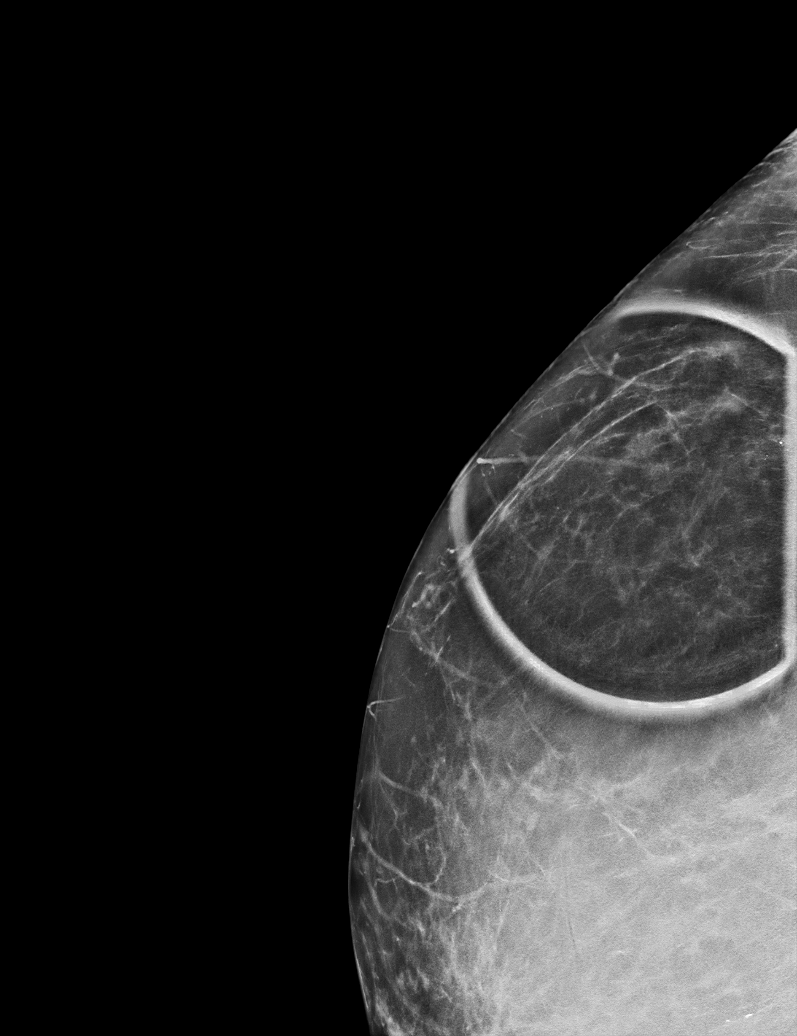

[R ML tomo · tomo slice 49/97.0]
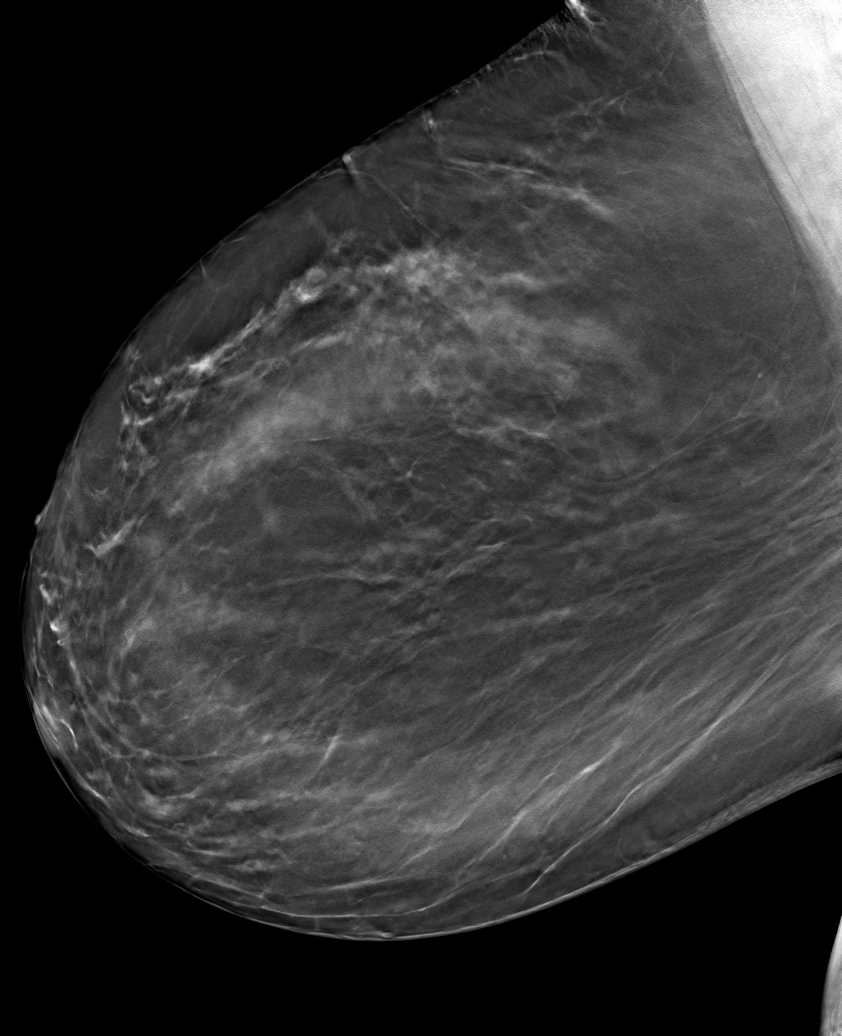

[R MLO tomo (1 of 2) · tomo slice 41/82.0]
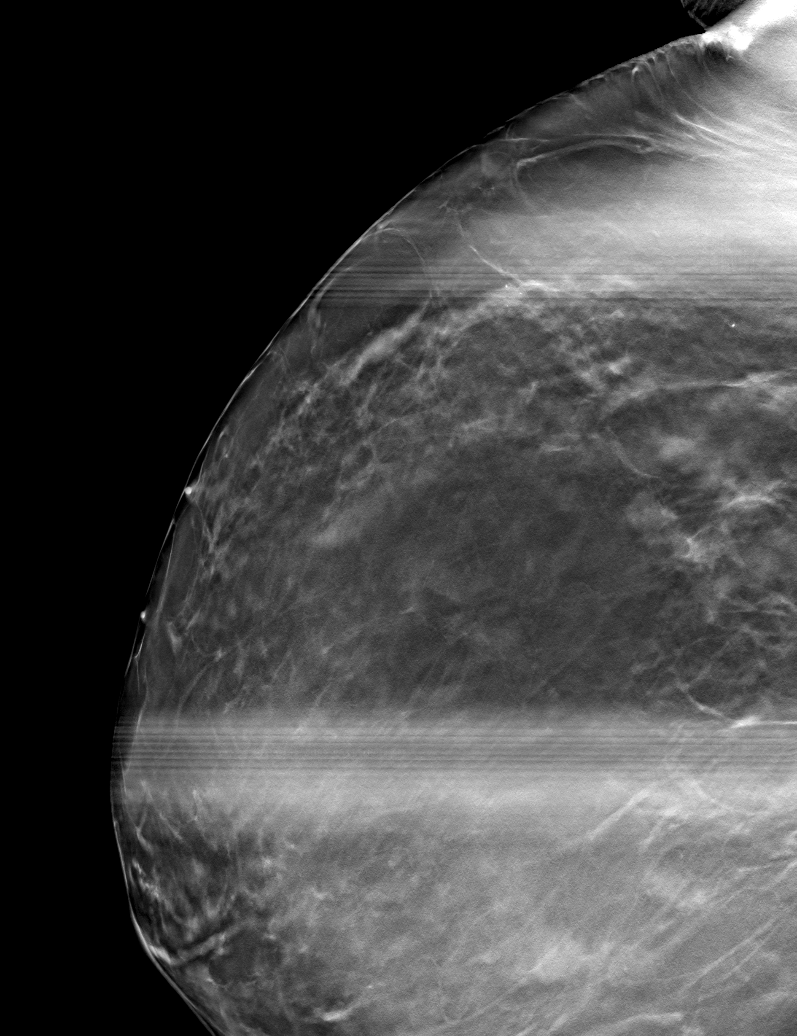

[R MLO tomo (2 of 2) · tomo slice 37/72.0]
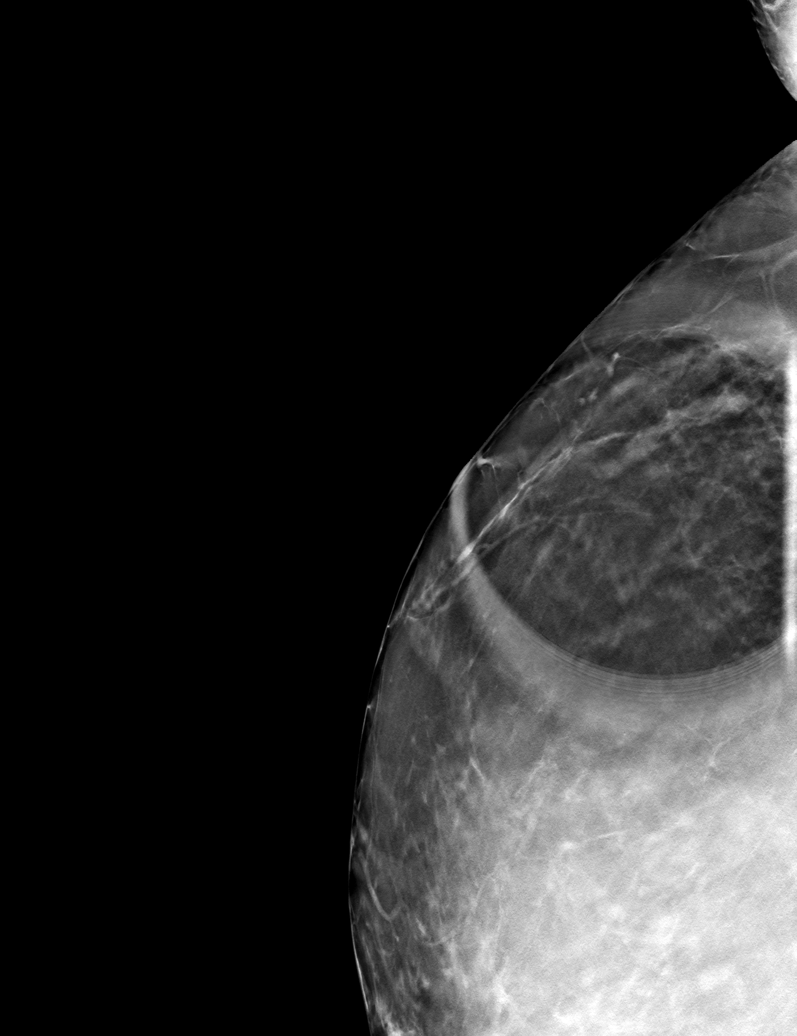

[6 of 18 positions shown; findings below may reference images not displayed]

ACR Breast Density Category b: There are scattered areas of
fibroglandular density.
FINDINGS: Spot-compression MLO view of the area of concern and a full field
mediolateral view were obtained.

The asymmetry in the upper breast at anterior to middle depth
persists but partially disperses with compression, likely indicating
an island of fibroglandular tissue. There is no underlying mass or
architectural distortion. The asymmetry is more inferiorly located
on the full field mediolateral view relative to its position on the
MLO view, indicating a location in the outer breast, therefore UPPER
OUTER QUADRANT.

Targeted ultrasound is performed in the UPPER OUTER QUADRANT,
demonstrating an oval circumscribed parallel anechoic mass with
scattered internal echoes at the 10 o'clock position 9 cm from the
nipple measuring approximately 6 x 3 x 5 mm, demonstrating posterior
acoustic enhancement and no internal power Doppler flow, within an
island of fibroglandular tissue, corresponding to the screening
mammographic finding. No suspicious solid mass or abnormal acoustic
shadowing is identified.
IMPRESSION: 1. No mammographic or sonographic evidence of malignancy involving
the RIGHT breast.
2. Benign 6 mm cyst in the UPPER OUTER QUADRANT at the 10 o'clock
position 9 cm from the nipple which accounts for the screening
mammographic finding.

RECOMMENDATION:
Screening mammogram in one year.(Code:[id number])

I have discussed the findings and recommendations with the patient.
If applicable, a reminder letter will be sent to the patient
regarding the next appointment.

BI-RADS CATEGORY  2: Benign.

## 2022-04-21 ENCOUNTER — Encounter: Payer: Self-pay | Admitting: Family Medicine

## 2022-04-21 ENCOUNTER — Ambulatory Visit: Payer: BC Managed Care – PPO | Admitting: Family Medicine

## 2022-04-21 VITALS — BP 120/86 | HR 90 | Ht 65.0 in | Wt 207.1 lb

## 2022-04-21 DIAGNOSIS — E559 Vitamin D deficiency, unspecified: Secondary | ICD-10-CM

## 2022-04-21 DIAGNOSIS — Z01419 Encounter for gynecological examination (general) (routine) without abnormal findings: Secondary | ICD-10-CM

## 2022-04-21 DIAGNOSIS — R7303 Prediabetes: Secondary | ICD-10-CM

## 2022-04-21 DIAGNOSIS — E669 Obesity, unspecified: Secondary | ICD-10-CM

## 2022-04-21 DIAGNOSIS — N951 Menopausal and female climacteric states: Secondary | ICD-10-CM

## 2022-04-21 DIAGNOSIS — I10 Essential (primary) hypertension: Secondary | ICD-10-CM

## 2022-04-21 DIAGNOSIS — R7302 Impaired glucose tolerance (oral): Secondary | ICD-10-CM

## 2022-04-21 DIAGNOSIS — E785 Hyperlipidemia, unspecified: Secondary | ICD-10-CM

## 2022-04-21 MED ORDER — TRIAMTERENE-HCTZ 75-50 MG PO TABS: 1.0000 | ORAL_TABLET | Freq: Every day | ORAL | 3 refills | Status: DC

## 2022-04-21 MED ORDER — VENLAFAXINE HCL ER 37.5 MG PO CP24: 37.5000 mg | ORAL_CAPSULE | Freq: Every day | ORAL | 5 refills | Status: DC

## 2022-04-21 NOTE — Patient Instructions (Signed)
F/U end June, call if you need me sooner  New med for hot flashes once daily effexor  BP is good  Fasting CBC, lipid, cmp and EGFr, HBA1C, TSH and vit D 5 to 7 days before June appt  Pls send in for nutrition help if progress is not as planned  You are referred to local [facility] for pelvic and pap  It is important that you exercise regularly at least 30 minutes 5 times a week. If you develop chest pain, have severe difficulty breathing, or feel very tired, stop exercising immediately and seek medical attention    Think about what you will eat, plan ahead. Choose " clean, green, fresh or frozen" over canned, processed or packaged foods which are more sugary, salty and fatty. 70 to 75% of food eaten should be vegetables and fruit. Three meals at set times with snacks allowed between meals, but they must be fruit or vegetables. Aim to eat over a 12 hour period , example 7 am to 7 pm, and STOP after  your last meal of the day. Drink water,generally about 64 ounces per day, no other drink is as healthy. Fruit juice is best enjoyed in a healthy way, by EATING the fruit.   Thanks for choosing [facility], we consider it a privelige to serve you.

## 2022-04-22 ENCOUNTER — Encounter: Payer: Self-pay | Admitting: Family Medicine

## 2022-04-22 NOTE — Assessment & Plan Note (Signed)
Patient educated about the importance of limiting  Carbohydrate intake , the need to commit to daily physical activity for a minimum of 30 minutes , and to commit weight loss. The fact that changes in all these areas will reduce or eliminate all together the development of diabetes is stressed.  Updated lab needed at/ before next visit.     Latest Ref Rng & Units 02/09/2022    3:24 PM 08/11/2021    4:43 PM 12/02/2020    9:13 AM 08/06/2020    9:06 AM 12/07/2019    8:04 AM  Diabetic Labs  HbA1c 4.8 - 5.6 % 6.0  5.7      Chol 100 - 199 mg/dL 226  207   187    HDL >39 mg/dL 74  71   72    Calc LDL 0 - 99 mg/dL 141  125   102    Triglycerides 0 - 149 mg/dL 66  64   68    Creatinine 0.57 - 1.00 mg/dL 0.88  0.90  1.00  0.97  0.93       04/21/2022    2:42 PM 04/21/2022    2:09 PM 02/10/2022    2:47 PM 02/10/2022    2:14 PM 02/10/2022    2:12 PM 10/22/2021    4:05 PM 10/22/2021    3:32 PM  BP/Weight  Systolic BP 123456 [id number] 123456 0000000 [id number]  Diastolic BP 86 89 78 78 93 94 93  Wt. (Lbs)  207.12   209.12  213  BMI  34.47 kg/m2   34.8 kg/m2  35.45 kg/m2       No data to display

## 2022-04-22 NOTE — Addendum Note (Signed)
Addended by: Fayrene Helper on: 04/22/2022 08:14 AM   Modules accepted: Orders

## 2022-04-22 NOTE — Assessment & Plan Note (Signed)
Controlled, no change in medication DASH diet and commitment to daily physical activity for a minimum of 30 minutes discussed and encouraged, as a part of hypertension management. The importance of attaining a healthy weight is also discussed.     04/21/2022    2:42 PM 04/21/2022    2:09 PM 02/10/2022    2:47 PM 02/10/2022    2:14 PM 02/10/2022    2:12 PM 10/22/2021    4:05 PM 10/22/2021    3:32 PM  BP/Weight  Systolic BP 123456 [id number] 123456 0000000 [id number] [id number] [id number]  Diastolic BP 86 89 78 78 93 94 93  Wt. (Lbs)  207.12   209.12  213  BMI  34.47 kg/m2   34.8 kg/m2  35.45 kg/m2

## 2022-04-22 NOTE — Assessment & Plan Note (Signed)
lipoma Updated lab needed at/ before next visit.

## 2022-04-22 NOTE — Assessment & Plan Note (Signed)
Updated lab needed at/ before next visit.   

## 2022-04-22 NOTE — Assessment & Plan Note (Signed)
Pt ed re exercise, dietary change, cool liquids, fans, dressing lightly Start effexor

## 2022-04-22 NOTE — Assessment & Plan Note (Signed)
  Patient re-educated about  the importance of commitment to a  minimum of 150 minutes of exercise per week as able.  The importance of healthy food choices with portion control discussed, as well as eating regularly and within a 12 hour window most days. The need to choose "clean , green" food 50 to 75% of the time is discussed, as well as to make water the primary drink and set a goal of 64 ounces water daily.       04/21/2022    2:09 PM 02/10/2022    2:12 PM 10/22/2021    3:32 PM  Weight /BMI  Weight 207 lb 1.9 oz 209 lb 1.9 oz 213 lb  Height '5\' 5"'$  (1.651 m) '5\' 5"'$  (1.651 m) '5\' 5"'$  (1.651 m)  BMI 34.47 kg/m2 34.8 kg/m2 35.45 kg/m2

## 2022-04-22 NOTE — Progress Notes (Signed)
Alexa Savage     MRN: [id number]      DOB: 15-Apr-1970   HPI Alexa Savage is here for follow up and re-evaluation of chronic medical conditions, medication management and review of any available recent lab and radiology data.  Preventive health is updated, specifically  Cancer screening and Immunization.   Questions or concerns regarding consultations or procedures which the PT has had in the interim are  addressed. The PT denies any adverse reactions to current medications since the last visit.  C/o increased and uncontrolled hot flashes  ROS Denies recent fever or chills. Denies sinus pressure, nasal congestion, ear pain or sore throat. Denies chest congestion, productive cough or wheezing. Denies chest pains, palpitations and leg swelling Denies abdominal pain, nausea, vomiting,diarrhea or constipation.   Denies dysuria, frequency, hesitancy or incontinence. Denies joint pain, swelling and limitation in mobility. Denies headaches, seizures, numbness, or tingling. Denies depression, anxiety or insomnia. Denies skin break down or rash.   PE  BP 120/86   Pulse 90   Ht '5\' 5"'$  (1.651 m)   Wt 207 lb 1.9 oz (93.9 kg)   SpO2 95%   BMI 34.47 kg/m   Patient alert and oriented and in no cardiopulmonary distress.  HEENT: No facial asymmetry, EOMI,     Neck supple .  Chest: Clear to auscultation bilaterally.  CVS: S1, S2 no murmurs, no S3.Regular rate.  ABD: Soft non tender.   Ext: No edema  MS: Adequate ROM spine, shoulders, hips and knees.  Skin: Intact, no ulcerations or rash noted.  Psych: Good eye contact, normal affect. Memory intact not anxious or depressed appearing.  CNS: CN 2-12 intact, power,  normal throughout.no focal deficits noted.   Assessment & Plan  HTN (hypertension) Controlled, no change in medication DASH diet and commitment to daily physical activity for a minimum of 30 minutes discussed and encouraged, as a part of hypertension  management. The importance of attaining a healthy weight is also discussed.     04/21/2022    2:42 PM 04/21/2022    2:09 PM 02/10/2022    2:47 PM 02/10/2022    2:14 PM 02/10/2022    2:12 PM 10/22/2021    4:05 PM 10/22/2021    3:32 PM  BP/Weight  Systolic BP 123456 [id number] 123456 0000000 [id number]  Diastolic BP 86 89 78 78 93 94 93  Wt. (Lbs)  207.12   209.12  213  BMI  34.47 kg/m2   34.8 kg/m2  35.45 kg/m2       Hot flashes due to menopause Pt ed re exercise, dietary change, cool liquids, fans, dressing lightly Start effexor  Prediabetes Patient educated about the importance of limiting  Carbohydrate intake , the need to commit to daily physical activity for a minimum of 30 minutes , and to commit weight loss. The fact that changes in all these areas will reduce or eliminate all together the development of diabetes is stressed.  Updated lab needed at/ before next visit.     Latest Ref Rng & Units 02/09/2022    3:24 PM 08/11/2021    4:43 PM 12/02/2020    9:13 AM 08/06/2020    9:06 AM 12/07/2019    8:04 AM  Diabetic Labs  HbA1c 4.8 - 5.6 % 6.0  5.7      Chol 100 - 199 mg/dL 226  207   187    HDL >39 mg/dL 74  71   72    Calc LDL  0 - 99 mg/dL 141  125   102    Triglycerides 0 - 149 mg/dL 66  64   68    Creatinine 0.57 - 1.00 mg/dL 0.88  0.90  1.00  0.97  0.93       04/21/2022    2:42 PM 04/21/2022    2:09 PM 02/10/2022    2:47 PM 02/10/2022    2:14 PM 02/10/2022    2:12 PM 10/22/2021    4:05 PM 10/22/2021    3:32 PM  BP/Weight  Systolic BP 123456 [id number] 123456 0000000 [id number] [id number] [id number]  Diastolic BP 86 89 78 78 93 94 93  Wt. (Lbs)  207.12   209.12  213  BMI  34.47 kg/m2   34.8 kg/m2  35.45 kg/m2       No data to display            Obesity (BMI 30.0-34.9)  Patient re-educated about  the importance of commitment to a  minimum of 150 minutes of exercise per week as able.  The importance of healthy food choices with portion control discussed, as well as eating regularly and within a 12 hour window  most days. The need to choose "clean , green" food 50 to 75% of the time is discussed, as well as to make water the primary drink and set a goal of 64 ounces water daily.       04/21/2022    2:09 PM 02/10/2022    2:12 PM 10/22/2021    3:32 PM  Weight /BMI  Weight 207 lb 1.9 oz 209 lb 1.9 oz 213 lb  Height '5\' 5"'$  (1.651 m) '5\' 5"'$  (1.651 m) '5\' 5"'$  (1.651 m)  BMI 34.47 kg/m2 34.8 kg/m2 35.45 kg/m2      Hyperlipidemia LDL goal <100 lipoma Updated lab needed at/ before next visit.   Vitamin D deficiency Updated lab needed at/ before next visit.

## 2022-04-29 ENCOUNTER — Ambulatory Visit
Admission: RE | Admit: 2022-04-29 | Discharge: 2022-04-29 | Disposition: A | Discharge status: Home / Self Care | Payer: BC Managed Care – PPO | Source: Ambulatory Visit | Attending: Family Medicine | Admitting: Family Medicine

## 2022-06-12 ENCOUNTER — Other Ambulatory Visit: Payer: Self-pay | Admitting: Family Medicine

## 2022-08-10 LAB — CBC
Hematocrit: 40.6 % (ref 34.0–46.6)
Hemoglobin: 13.3 g/dL (ref 11.1–15.9)
MCH: 28.8 pg (ref 26.6–33.0)
MCHC: 32.8 g/dL (ref 31.5–35.7)
MCV: 88 fL (ref 79–97)
Platelets: 238 10*3/uL (ref 150–450)
RBC: 4.62 x10E6/uL (ref 3.77–5.28)
RDW: 12.6 % (ref 11.7–15.4)
WBC: 4.4 10*3/uL (ref 3.4–10.8)

## 2022-08-10 LAB — LIPID PANEL
Chol/HDL Ratio: 3 ratio (ref 0.0–4.4)
Cholesterol, Total: 216 mg/dL — ABNORMAL HIGH (ref 100–199)
HDL: 73 mg/dL (ref >39)
LDL Chol Calc (NIH): 130 mg/dL — ABNORMAL HIGH (ref 0–99)
Triglycerides: 74 mg/dL (ref 0–149)
VLDL Cholesterol Cal: 13 mg/dL (ref 5–40)

## 2022-08-10 LAB — CMP14+EGFR
ALT: 14 IU/L (ref 0–32)
AST: 17 IU/L (ref 0–40)
Albumin: 4.4 g/dL (ref 3.8–4.9)
Alkaline Phosphatase: 82 IU/L (ref 44–121)
BUN/Creatinine Ratio: 12 (ref 9–23)
BUN: 11 mg/dL (ref 6–24)
Bilirubin Total: 0.3 mg/dL (ref 0.0–1.2)
CO2: 24 mmol/L (ref 20–29)
Calcium: 10 mg/dL (ref 8.7–10.2)
Chloride: 104 mmol/L (ref 96–106)
Creatinine, Ser: 0.9 mg/dL (ref 0.57–1.00)
Globulin, Total: 2.7 g/dL (ref 1.5–4.5)
Glucose: 91 mg/dL (ref 70–99)
Potassium: 4.7 mmol/L (ref 3.5–5.2)
Sodium: 141 mmol/L (ref 134–144)
Total Protein: 7.1 g/dL (ref 6.0–8.5)
eGFR: 76 mL/min/{1.73_m2} (ref >59)

## 2022-08-10 LAB — HEMOGLOBIN A1C
Est. average glucose Bld gHb Est-mCnc: 123 mg/dL
Hgb A1c MFr Bld: 5.9 % — ABNORMAL HIGH (ref 4.8–5.6)

## 2022-08-10 LAB — TSH: TSH: 2.48 u[IU]/mL (ref 0.450–4.500)

## 2022-08-10 LAB — VITAMIN D 25 HYDROXY (VIT D DEFICIENCY, FRACTURES): Vit D, 25-Hydroxy: 18.1 ng/mL — ABNORMAL LOW (ref 30.0–100.0)

## 2022-08-11 ENCOUNTER — Ambulatory Visit: Payer: BC Managed Care – PPO | Admitting: Family Medicine

## 2022-08-11 ENCOUNTER — Encounter: Payer: Self-pay | Admitting: Family Medicine

## 2022-08-11 VITALS — BP 120/80 | HR 79 | Ht 65.0 in | Wt 214.0 lb

## 2022-08-11 DIAGNOSIS — E669 Obesity, unspecified: Secondary | ICD-10-CM | POA: Diagnosis not present

## 2022-08-11 DIAGNOSIS — R7303 Prediabetes: Secondary | ICD-10-CM

## 2022-08-11 DIAGNOSIS — N951 Menopausal and female climacteric states: Secondary | ICD-10-CM

## 2022-08-11 DIAGNOSIS — I10 Essential (primary) hypertension: Secondary | ICD-10-CM

## 2022-08-11 DIAGNOSIS — E785 Hyperlipidemia, unspecified: Secondary | ICD-10-CM

## 2022-08-11 MED ORDER — TRIAMTERENE-HCTZ 75-50 MG PO TABS: 1.0000 | ORAL_TABLET | Freq: Every day | ORAL | 3 refills | Status: DC

## 2022-08-11 MED ORDER — PHENTERMINE HCL 37.5 MG PO TABS: ORAL_TABLET | ORAL | 1 refills | Status: DC

## 2022-08-11 MED ORDER — DILTIAZEM HCL ER COATED BEADS 180 MG PO CP24: 180.0000 mg | ORAL_CAPSULE | Freq: Every day | ORAL | 3 refills | Status: DC

## 2022-08-11 NOTE — Patient Instructions (Addendum)
F/U in 14 weeks, call if you need me sooner   Some improvement in labs with normal kidney , liver and thyroid function  Excellent BP, CONGRATS , keep it up!  New is half phentermine every morning  Commit to eating regulatrly and sweet needs to go   Please commit to vit D3, 5000 international units daily, your level is low  Wishing you success, ione day at a time!   FRUIT pure  is GREAT and leave the sodas, only water or smoothies or unsweetened beverages  It is important that you exercise regularly at least 30 minutes 5 times a week. If you develop chest pain, have severe difficulty breathing, or feel very tired, stop exercising immediately and seek medical attention   Thanks for choosing [facility], we consider it a privelige to serve you.

## 2022-08-11 NOTE — Assessment & Plan Note (Signed)
Hyperlipidemia:Low fat diet discussed and encouraged.   Lipid Panel  Lab Results  Component Value Date   CHOL 216 (H) 08/09/2022   HDL 73 08/09/2022   LDLCALC 130 (H) 08/09/2022   TRIG 74 08/09/2022   CHOLHDL 3.0 08/09/2022     Neds to reduce fat intake

## 2022-08-11 NOTE — Assessment & Plan Note (Signed)
  Patient re-educated about  the importance of commitment to a  minimum of 150 minutes of exercise per week as able.  The importance of healthy food choices with portion control discussed, as well as eating regularly and within a 12 hour window most days. The need to choose "clean , green" food 50 to 75% of the time is discussed, as well as to make water the primary drink and set a goal of 64 ounces water daily.       08/11/2022    3:03 PM 04/21/2022    2:09 PM 02/10/2022    2:12 PM  Weight /BMI  Weight 214 lb 207 lb 1.9 oz 209 lb 1.9 oz  Height 5\' 5"  (1.651 m) 5\' 5"  (1.651 m) 5\' 5"  (1.651 m)  BMI 35.61 kg/m2 34.47 kg/m2 34.8 kg/m2    Deteriorated, start phentermine half tab daily and attend Nutrition classes for the community

## 2022-08-11 NOTE — Progress Notes (Signed)
LOVELYN SHEERAN     MRN: [id number]      DOB: 28-May-1970  Chief Complaint  Patient presents with   Follow-up    Follow up     HPI Ms. Grimley is here for follow up and re-evaluation of chronic medical conditions, medication management and review of any available recent lab and radiology data.  Preventive health is updated, specifically  Cancer screening and Immunization.   The PT denies any adverse reactions to current medications since the last visit.  Took effexor on 2 occasions only, electing vitamin fo hot flashes Reports no regular exercise routine, and she also continues to eat an excessive amount of sweet bread and pasta.  As a result she has lost no weight and is concerned the medication to help with weight loss.  She does intend to work on lifestyle to facilitate this. ROS Denies recent fever or chills. Denies sinus pressure, nasal congestion, ear pain or sore throat. Denies chest congestion, productive cough or wheezing. Denies chest pains, palpitations and leg swelling Denies abdominal pain, nausea, vomiting,diarrhea or constipation.   Denies dysuria, frequency, hesitancy or incontinence. Denies joint pain, swelling and limitation in mobility. Denies headaches, seizures, numbness, or tingling. Denies depression, anxiety or insomnia. Denies skin break down or rash.   PE  BP 120/80   Pulse 79   Ht 5\' 5"  (1.651 m)   Wt 214 lb (97.1 kg)   SpO2 94%   BMI 35.61 kg/m   Patient alert and oriented and in no cardiopulmonary distress.  HEENT: No facial asymmetry, EOMI,     Neck supple .  Chest: Clear to auscultation bilaterally.  CVS: S1, S2 no murmurs, no S3.Regular rate.  ABD: Soft non tender.   Ext: No edema  MS: Adequate ROM spine, shoulders, hips and knees.  Skin: Intact, no ulcerations or rash noted.  Psych: Good eye contact, normal affect. Memory intact not anxious or depressed appearing.  CNS: CN 2-12 intact, power,  normal throughout.no focal  deficits noted.   Assessment & Plan  Prediabetes Patient educated about the importance of limiting  Carbohydrate intake , the need to commit to daily physical activity for a minimum of 30 minutes , and to commit weight loss. The fact that changes in all these areas will reduce or eliminate all together the development of diabetes is stressed.      Latest Ref Rng & Units 08/09/2022   12:06 PM 02/09/2022    3:24 PM 08/11/2021    4:43 PM 12/02/2020    9:13 AM 08/06/2020    9:06 AM  Diabetic Labs  HbA1c 4.8 - 5.6 % 5.9  6.0  5.7     Chol 100 - 199 mg/dL 413  244  010   272   HDL >39 mg/dL 73  74  71   72   Calc LDL 0 - 99 mg/dL 536  644  034   742   Triglycerides 0 - 149 mg/dL 74  66  64   68   Creatinine 0.57 - 1.00 mg/dL 5.95  6.38  7.56  4.33  0.97       08/11/2022    3:19 PM 08/11/2022    3:03 PM 04/21/2022    2:42 PM 04/21/2022    2:09 PM 02/10/2022    2:47 PM 02/10/2022    2:14 PM 02/10/2022    2:12 PM  BP/Weight  Systolic BP 120 137 120 133 120 138 143  Diastolic BP 80 87 86 89  78 78 93  Wt. (Lbs)  214  207.12   209.12  BMI  35.61 kg/m2  34.47 kg/m2   34.8 kg/m2       No data to display          Improved, which is good  HTN (hypertension) Controlled, no change in medication DASH diet and commitment to daily physical activity for a minimum of 30 minutes discussed and encouraged, as a part of hypertension management. The importance of attaining a healthy weight is also discussed.     08/11/2022    3:19 PM 08/11/2022    3:03 PM 04/21/2022    2:42 PM 04/21/2022    2:09 PM 02/10/2022    2:47 PM 02/10/2022    2:14 PM 02/10/2022    2:12 PM  BP/Weight  Systolic BP 120 137 120 133 120 138 143  Diastolic BP 80 87 86 89 78 78 93  Wt. (Lbs)  214  207.12   209.12  BMI  35.61 kg/m2  34.47 kg/m2   34.8 kg/m2       Hyperlipidemia LDL goal <100 Hyperlipidemia:Low fat diet discussed and encouraged.   Lipid Panel  Lab Results  Component Value Date   CHOL 216 (H)  08/09/2022   HDL 73 08/09/2022   LDLCALC 130 (H) 08/09/2022   TRIG 74 08/09/2022   CHOLHDL 3.0 08/09/2022     Neds to reduce fat intake  Obesity (BMI 30.0-34.9)  Patient re-educated about  the importance of commitment to a  minimum of 150 minutes of exercise per week as able.  The importance of healthy food choices with portion control discussed, as well as eating regularly and within a 12 hour window most days. The need to choose "clean , green" food 50 to 75% of the time is discussed, as well as to make water the primary drink and set a goal of 64 ounces water daily.       08/11/2022    3:03 PM 04/21/2022    2:09 PM 02/10/2022    2:12 PM  Weight /BMI  Weight 214 lb 207 lb 1.9 oz 209 lb 1.9 oz  Height 5\' 5"  (1.651 m) 5\' 5"  (1.651 m) 5\' 5"  (1.651 m)  BMI 35.61 kg/m2 34.47 kg/m2 34.8 kg/m2    Deteriorated, start phentermine half tab daily and attend Nutrition classes for the community  Hot flashes due to menopause No f/u with prescription medication and electing to take a vitamin , has no started yet, I reminded her of the "Natural approach to night sweats

## 2022-08-11 NOTE — Assessment & Plan Note (Signed)
Patient educated about the importance of limiting  Carbohydrate intake , the need to commit to daily physical activity for a minimum of 30 minutes , and to commit weight loss. The fact that changes in all these areas will reduce or eliminate all together the development of diabetes is stressed.      Latest Ref Rng & Units 08/09/2022   12:06 PM 02/09/2022    3:24 PM 08/11/2021    4:43 PM 12/02/2020    9:13 AM 08/06/2020    9:06 AM  Diabetic Labs  HbA1c 4.8 - 5.6 % 5.9  6.0  5.7     Chol 100 - 199 mg/dL 161  096  045   409   HDL >39 mg/dL 73  74  71   72   Calc LDL 0 - 99 mg/dL 811  914  782   956   Triglycerides 0 - 149 mg/dL 74  66  64   68   Creatinine 0.57 - 1.00 mg/dL 2.13  0.86  5.78  4.69  0.97       08/11/2022    3:19 PM 08/11/2022    3:03 PM 04/21/2022    2:42 PM 04/21/2022    2:09 PM 02/10/2022    2:47 PM 02/10/2022    2:14 PM 02/10/2022    2:12 PM  BP/Weight  Systolic BP 120 137 120 133 120 138 143  Diastolic BP 80 87 86 89 78 78 93  Wt. (Lbs)  214  207.12   209.12  BMI  35.61 kg/m2  34.47 kg/m2   34.8 kg/m2       No data to display          Improved, which is good

## 2022-08-11 NOTE — Assessment & Plan Note (Signed)
No f/u with prescription medication and electing to take a vitamin , has no started yet, I reminded her of the "Natural approach to night sweats

## 2022-08-11 NOTE — Assessment & Plan Note (Signed)
Controlled, no change in medication DASH diet and commitment to daily physical activity for a minimum of 30 minutes discussed and encouraged, as a part of hypertension management. The importance of attaining a healthy weight is also discussed.     08/11/2022    3:19 PM 08/11/2022    3:03 PM 04/21/2022    2:42 PM 04/21/2022    2:09 PM 02/10/2022    2:47 PM 02/10/2022    2:14 PM 02/10/2022    2:12 PM  BP/Weight  Systolic BP 120 137 120 133 120 138 143  Diastolic BP 80 87 86 89 78 78 93  Wt. (Lbs)  214  207.12   209.12  BMI  35.61 kg/m2  34.47 kg/m2   34.8 kg/m2

## 2022-09-18 ENCOUNTER — Encounter: Payer: Self-pay | Admitting: Family Medicine

## 2022-11-17 ENCOUNTER — Ambulatory Visit: Payer: BC Managed Care – PPO | Admitting: Family Medicine

## 2022-12-22 ENCOUNTER — Encounter: Payer: Self-pay | Admitting: Family Medicine

## 2022-12-22 ENCOUNTER — Ambulatory Visit: Payer: BC Managed Care – PPO | Admitting: Family Medicine

## 2022-12-22 VITALS — BP 124/84 | HR 78 | Ht 65.0 in | Wt 211.0 lb

## 2022-12-22 DIAGNOSIS — Z1231 Encounter for screening mammogram for malignant neoplasm of breast: Secondary | ICD-10-CM

## 2022-12-22 DIAGNOSIS — R7303 Prediabetes: Secondary | ICD-10-CM

## 2022-12-22 DIAGNOSIS — J3089 Other allergic rhinitis: Secondary | ICD-10-CM

## 2022-12-22 DIAGNOSIS — E785 Hyperlipidemia, unspecified: Secondary | ICD-10-CM

## 2022-12-22 DIAGNOSIS — I10 Essential (primary) hypertension: Secondary | ICD-10-CM | POA: Diagnosis not present

## 2022-12-22 DIAGNOSIS — Z01419 Encounter for gynecological examination (general) (routine) without abnormal findings: Secondary | ICD-10-CM

## 2022-12-22 DIAGNOSIS — E559 Vitamin D deficiency, unspecified: Secondary | ICD-10-CM

## 2022-12-22 MED ORDER — MONTELUKAST SODIUM 10 MG PO TABS: 10.0000 mg | ORAL_TABLET | Freq: Every day | ORAL | 3 refills | Status: DC

## 2022-12-22 NOTE — Patient Instructions (Addendum)
F/U in 4 months, call if you need me sooner  New for allergies is montelukast  Pls check ins for [facility] coverage with your new change, if covered I will prescribe again (Wegovy, Zepbound)  [facility] at [facility]  Fasting lipid, cmp and eGFr, vit D and hBa1C first week in January( [facility])  You are referred per request to Dr Donzetta Matters for exam with pap ( ensure she is in network)  It is important that you exercise regularly at least 30 minutes 5 times a week. If you develop chest pain, have severe difficulty breathing, or feel very tired, stop exercising immediately and seek medical attention   Covid vaccine is recommended and is at your pharmacy  Weight loss goal of 8 pounds  Thanks for choosing [facility], we consider it a privelige to serve you.

## 2022-12-22 NOTE — Progress Notes (Signed)
Alexa Savage     MRN: [id number]      DOB: 1970/04/14  Chief Complaint  Patient presents with   Follow-up    Follow up sinus issues x 2-3 days    HPI Ms. Key is here for follow up and re-evaluation of chronic medical conditions, medication management and review of any available recent lab and radiology data.  Preventive health is updated, specifically  Cancer screening and Immunization.  Needs pap and referral sent Has tolerated phentermine but not taking it consistently. The PT denies any adverse reactions to current medications since the last visit.  Still no exercise commitment, will work on this ROS Denies recent fever or chills. 3 day h/o  sinus pressure, nasal congestion, denies  ear pain or sore throat. Denies chest congestion, productive cough or wheezing. Denies chest pains, palpitations and leg swelling Denies abdominal pain, nausea, vomiting,diarrhea or constipation.   Denies dysuria, frequency, hesitancy or incontinence. Denies joint pain, swelling and limitation in mobility. Denies headaches, seizures, numbness, or tingling. Denies depression, anxiety or insomnia. Denies skin break down or rash.   PE  BP 124/84   Pulse 78   Ht 5\' 5"  (1.651 m)   Wt 211 lb 0.6 oz (95.7 kg)   SpO2 96%   BMI 35.12 kg/m   Patient alert and oriented and in no cardiopulmonary distress.  HEENT: No facial asymmetry, EOMI,     Neck supple .no sinu tenderness  Chest: Clear to auscultation bilaterally.  CVS: S1, S2 no murmurs, no S3.Regular rate.  ABD: Soft non tender.   Ext: No edema  MS: Adequate ROM spine, shoulders, hips and knees.  Skin: Intact, no ulcerations or rash noted.  Psych: Good eye contact, normal affect. Memory intact not anxious or depressed appearing.  CNS: CN 2-12 intact, power,  normal throughout.no focal deficits noted.   Assessment & Plan  HTN (hypertension) Controlled, no change in medication DASH diet and commitment to daily physical  activity for a minimum of 30 minutes discussed and encouraged, as a part of hypertension management. The importance of attaining a healthy weight is also discussed.     12/22/2022    3:55 PM 12/22/2022    3:39 PM 12/22/2022    3:38 PM 08/11/2022    3:19 PM 08/11/2022    3:03 PM 04/21/2022    2:42 PM 04/21/2022    2:09 PM  BP/Weight  Systolic BP 124 124 163 120 137 120 133  Diastolic BP 84 81 98 80 87 86 89  Wt. (Lbs)   211.04  214  207.12  BMI   35.12 kg/m2  35.61 kg/m2  34.47 kg/m2       Hyperlipidemia LDL goal <100 Hyperlipidemia:Low fat diet discussed and encouraged.   Lipid Panel  Lab Results  Component Value Date   CHOL 216 (H) 08/09/2022   HDL 73 08/09/2022   LDLCALC 130 (H) 08/09/2022   TRIG 74 08/09/2022   CHOLHDL 3.0 08/09/2022     Updated lab needed at/ before next visit.   Morbid obesity (HCC)  Patient re-educated about  the importance of commitment to a  minimum of 150 minutes of exercise per week as able.  The importance of healthy food choices with portion control discussed, as well as eating regularly and within a 12 hour window most days. The need to choose "clean , green" food 50 to 75% of the time is discussed, as well as to make water the primary drink and set a goal of  64 ounces water daily.       12/22/2022    3:38 PM 08/11/2022    3:03 PM 04/21/2022    2:09 PM  Weight /BMI  Weight 211 lb 0.6 oz 214 lb 207 lb 1.9 oz  Height 5\' 5"  (1.651 m) 5\' 5"  (1.651 m) 5\' 5"  (1.651 m)  BMI 35.12 kg/m2 35.61 kg/m2 34.47 kg/m2    Needs to commit to weight loss program , work in progress  Prediabetes Patient educated about the importance of limiting  Carbohydrate intake , the need to commit to daily physical activity for a minimum of 30 minutes , and to commit weight loss. The fact that changes in all these areas will reduce or eliminate all together the development of diabetes is stressed.      Latest Ref Rng & Units 08/09/2022   12:06 PM 02/09/2022    3:24  PM 08/11/2021    4:43 PM 12/02/2020    9:13 AM 08/06/2020    9:06 AM  Diabetic Labs  HbA1c 4.8 - 5.6 % 5.9  6.0  5.7     Chol 100 - 199 mg/dL 914  782  956   213   HDL >39 mg/dL 73  74  71   72   Calc LDL 0 - 99 mg/dL 086  578  469   629   Triglycerides 0 - 149 mg/dL 74  66  64   68   Creatinine 0.57 - 1.00 mg/dL 5.28  4.13  2.44  0.10  0.97       12/22/2022    3:55 PM 12/22/2022    3:39 PM 12/22/2022    3:38 PM 08/11/2022    3:19 PM 08/11/2022    3:03 PM 04/21/2022    2:42 PM 04/21/2022    2:09 PM  BP/Weight  Systolic BP 124 124 163 120 137 120 133  Diastolic BP 84 81 98 80 87 86 89  Wt. (Lbs)   211.04  214  207.12  BMI   35.12 kg/m2  35.61 kg/m2  34.47 kg/m2       No data to display          Updated lab needed at/ before next visit.   Perennial and seasonal allergic rhinitis Current symptom flare montelukast prescribed for daily , then as needed, use  Vitamin D deficiency Updated lab needed at/ before next visit.

## 2022-12-22 NOTE — Assessment & Plan Note (Signed)
Current symptom flare montelukast prescribed for daily , then as needed, use

## 2022-12-22 NOTE — Assessment & Plan Note (Signed)
  Patient re-educated about  the importance of commitment to a  minimum of 150 minutes of exercise per week as able.  The importance of healthy food choices with portion control discussed, as well as eating regularly and within a 12 hour window most days. The need to choose "clean , green" food 50 to 75% of the time is discussed, as well as to make water the primary drink and set a goal of 64 ounces water daily.       12/22/2022    3:38 PM 08/11/2022    3:03 PM 04/21/2022    2:09 PM  Weight /BMI  Weight 211 lb 0.6 oz 214 lb 207 lb 1.9 oz  Height 5\' 5"  (1.651 m) 5\' 5"  (1.651 m) 5\' 5"  (1.651 m)  BMI 35.12 kg/m2 35.61 kg/m2 34.47 kg/m2    Needs to commit to weight loss program , work in progress

## 2022-12-22 NOTE — Assessment & Plan Note (Signed)
Updated lab needed at/ before next visit.   

## 2022-12-22 NOTE — Assessment & Plan Note (Signed)
Controlled, no change in medication DASH diet and commitment to daily physical activity for a minimum of 30 minutes discussed and encouraged, as a part of hypertension management. The importance of attaining a healthy weight is also discussed.     12/22/2022    3:55 PM 12/22/2022    3:39 PM 12/22/2022    3:38 PM 08/11/2022    3:19 PM 08/11/2022    3:03 PM 04/21/2022    2:42 PM 04/21/2022    2:09 PM  BP/Weight  Systolic BP 124 124 163 120 137 120 133  Diastolic BP 84 81 98 80 87 86 89  Wt. (Lbs)   211.04  214  207.12  BMI   35.12 kg/m2  35.61 kg/m2  34.47 kg/m2

## 2022-12-22 NOTE — Assessment & Plan Note (Signed)
Hyperlipidemia:Low fat diet discussed and encouraged.   Lipid Panel  Lab Results  Component Value Date   CHOL 216 (H) 08/09/2022   HDL 73 08/09/2022   LDLCALC 130 (H) 08/09/2022   TRIG 74 08/09/2022   CHOLHDL 3.0 08/09/2022     Updated lab needed at/ before next visit.

## 2022-12-22 NOTE — Assessment & Plan Note (Signed)
Patient educated about the importance of limiting  Carbohydrate intake , the need to commit to daily physical activity for a minimum of 30 minutes , and to commit weight loss. The fact that changes in all these areas will reduce or eliminate all together the development of diabetes is stressed.      Latest Ref Rng & Units 08/09/2022   12:06 PM 02/09/2022    3:24 PM 08/11/2021    4:43 PM 12/02/2020    9:13 AM 08/06/2020    9:06 AM  Diabetic Labs  HbA1c 4.8 - 5.6 % 5.9  6.0  5.7     Chol 100 - 199 mg/dL 865  784  696   295   HDL >39 mg/dL 73  74  71   72   Calc LDL 0 - 99 mg/dL 284  132  440   102   Triglycerides 0 - 149 mg/dL 74  66  64   68   Creatinine 0.57 - 1.00 mg/dL 7.25  3.66  4.40  3.47  0.97       12/22/2022    3:55 PM 12/22/2022    3:39 PM 12/22/2022    3:38 PM 08/11/2022    3:19 PM 08/11/2022    3:03 PM 04/21/2022    2:42 PM 04/21/2022    2:09 PM  BP/Weight  Systolic BP 124 124 163 120 137 120 133  Diastolic BP 84 81 98 80 87 86 89  Wt. (Lbs)   211.04  214  207.12  BMI   35.12 kg/m2  35.61 kg/m2  34.47 kg/m2       No data to display          Updated lab needed at/ before next visit.

## 2023-04-19 ENCOUNTER — Encounter: Payer: Self-pay | Admitting: Family Medicine

## 2023-04-19 LAB — CMP14+EGFR
ALT: 13 IU/L (ref 0–32)
AST: 17 IU/L (ref 0–40)
Albumin: 4.4 g/dL (ref 3.8–4.9)
Alkaline Phosphatase: 83 IU/L (ref 44–121)
BUN/Creatinine Ratio: 13 (ref 9–23)
BUN: 11 mg/dL (ref 6–24)
Bilirubin Total: 0.4 mg/dL (ref 0.0–1.2)
CO2: 21 mmol/L (ref 20–29)
Calcium: 9.6 mg/dL (ref 8.7–10.2)
Chloride: 106 mmol/L (ref 96–106)
Creatinine, Ser: 0.82 mg/dL (ref 0.57–1.00)
Globulin, Total: 2.3 g/dL (ref 1.5–4.5)
Glucose: 83 mg/dL (ref 70–99)
Potassium: 4.5 mmol/L (ref 3.5–5.2)
Sodium: 143 mmol/L (ref 134–144)
Total Protein: 6.7 g/dL (ref 6.0–8.5)
eGFR: 85 mL/min/{1.73_m2} (ref >59)

## 2023-04-19 LAB — VITAMIN D 25 HYDROXY (VIT D DEFICIENCY, FRACTURES): Vit D, 25-Hydroxy: 22.3 ng/mL — ABNORMAL LOW (ref 30.0–100.0)

## 2023-04-19 LAB — LIPID PANEL
Chol/HDL Ratio: 2.8 ratio (ref 0.0–4.4)
Cholesterol, Total: 195 mg/dL (ref 100–199)
HDL: 69 mg/dL (ref >39)
LDL Chol Calc (NIH): 111 mg/dL — ABNORMAL HIGH (ref 0–99)
Triglycerides: 86 mg/dL (ref 0–149)
VLDL Cholesterol Cal: 15 mg/dL (ref 5–40)

## 2023-04-19 LAB — HEMOGLOBIN A1C
Est. average glucose Bld gHb Est-mCnc: 114 mg/dL
Hgb A1c MFr Bld: 5.6 % (ref 4.8–5.6)

## 2023-04-21 ENCOUNTER — Encounter: Payer: Self-pay | Admitting: Family Medicine

## 2023-04-21 ENCOUNTER — Ambulatory Visit: Payer: BC Managed Care – PPO | Admitting: Family Medicine

## 2023-04-21 VITALS — BP 142/96 | HR 78 | Resp 16 | Ht 66.0 in | Wt 211.0 lb

## 2023-04-21 DIAGNOSIS — E785 Hyperlipidemia, unspecified: Secondary | ICD-10-CM

## 2023-04-21 DIAGNOSIS — I10 Essential (primary) hypertension: Secondary | ICD-10-CM

## 2023-04-21 DIAGNOSIS — G8929 Other chronic pain: Secondary | ICD-10-CM

## 2023-04-21 DIAGNOSIS — M5441 Lumbago with sciatica, right side: Secondary | ICD-10-CM

## 2023-04-21 DIAGNOSIS — E559 Vitamin D deficiency, unspecified: Secondary | ICD-10-CM

## 2023-04-21 DIAGNOSIS — R7303 Prediabetes: Secondary | ICD-10-CM

## 2023-04-21 MED ORDER — MELOXICAM 15 MG PO TABS: 15.0000 mg | ORAL_TABLET | Freq: Every day | ORAL | 0 refills | Status: DC

## 2023-04-21 NOTE — Assessment & Plan Note (Addendum)
 Flare in 02/2023, if recurrs with as much severity will use a trial of meloxicam for short term, if no response , or new neurologic symptoms develop, pt to call for ortho referral

## 2023-04-21 NOTE — Patient Instructions (Addendum)
 F/U in 16 to 18 weeks, call if you need me sooner  Nurse bP check in 2 weeks  pLEASE commit to taking BOTH blood pressure pills together every day arounfd rthe same time  cBC, TSH, VitD, fasting chem 7 and eGFr to be drawn 3 to 5 days before next appointment   Use eS tylenol as needed for back pAin  If you have another bad flare , I have prescribed 7 days of anti inflammatory medication to take with the tylenol and recommend Orthopefic eval, just let me know if needed  cONGRATS on improved labs, keep it up  Thanks for choosing [facility], we consider it a privelige to serve you.

## 2023-04-25 ENCOUNTER — Encounter: Payer: Self-pay | Admitting: Family Medicine

## 2023-04-25 NOTE — Assessment & Plan Note (Signed)
 Updated lab needed at/ before next visit.

## 2023-04-25 NOTE — Assessment & Plan Note (Addendum)
 Patient educated about the importance of limiting  Carbohydrate intake , the need to commit to daily physical activity for a minimum of 30 minutes , and to commit weight loss. The fact that changes in all these areas will reduce or eliminate all together the development of diabetes is stressed.  Improved and now n normal, pt applauded on this     Latest Ref Rng & Units 04/18/2023    1:41 PM 08/09/2022   12:06 PM 02/09/2022    3:24 PM 08/11/2021    4:43 PM 12/02/2020    9:13 AM  Diabetic Labs  HbA1c 4.8 - 5.6 % 5.6  5.9  6.0  5.7    Chol 100 - 199 mg/dL 409  811  914  782    HDL >39 mg/dL 69  73  74  71    Calc LDL 0 - 99 mg/dL 956  213  086  578    Triglycerides 0 - 149 mg/dL 86  74  66  64    Creatinine 0.57 - 1.00 mg/dL 4.69  6.29  5.28  4.13  1.00       04/21/2023    3:51 PM 04/21/2023    3:19 PM 12/22/2022    3:55 PM 12/22/2022    3:39 PM 12/22/2022    3:38 PM 08/11/2022    3:19 PM 08/11/2022    3:03 PM  BP/Weight  Systolic BP 142 153 124 124 163 120 137  Diastolic BP 96 82 84 81 98 80 87  Wt. (Lbs)  211   211.04  214  BMI  34.06 kg/m2   35.12 kg/m2  35.61 kg/m2       No data to display

## 2023-04-25 NOTE — Assessment & Plan Note (Signed)
 Uncontrolled, non compliant, takes meds unreliably DASH diet and commitment to daily physical activity for a minimum of 30 minutes discussed and encouraged, as a part of hypertension management. The importance of attaining a healthy weight is also discussed.     04/21/2023    3:51 PM 04/21/2023    3:19 PM 12/22/2022    3:55 PM 12/22/2022    3:39 PM 12/22/2022    3:38 PM 08/11/2022    3:19 PM 08/11/2022    3:03 PM  BP/Weight  Systolic BP 142 153 124 124 163 120 137  Diastolic BP 96 82 84 81 98 80 87  Wt. (Lbs)  211   211.04  214  BMI  34.06 kg/m2   35.12 kg/m2  35.61 kg/m2

## 2023-04-25 NOTE — Assessment & Plan Note (Signed)
  Patient re-educated about  the importance of commitment to a  minimum of 150 minutes of exercise per week as able.  The importance of healthy food choices with portion control discussed, as well as eating regularly and within a 12 hour window most days. The need to choose "clean , green" food 50 to 75% of the time is discussed, as well as to make water the primary drink and set a goal of 64 ounces water daily.       04/21/2023    3:19 PM 12/22/2022    3:38 PM 08/11/2022    3:03 PM  Weight /BMI  Weight 211 lb 211 lb 0.6 oz 214 lb  Height 5\' 6"  (1.676 m) 5\' 5"  (1.651 m) 5\' 5"  (1.651 m)  BMI 34.06 kg/m2 35.12 kg/m2 35.61 kg/m2    Unchanged

## 2023-04-25 NOTE — Progress Notes (Signed)
 Alexa Savage     MRN: [id number]      DOB: 12-Aug-1970  Chief Complaint  Patient presents with   Hypertension    Follow up visit    Back Pain    Will have episodes where she starts hurting in her lower right side of back and it was really bad in January and causes her to have to lay down but it comes and goes maybe once a week or sometimes maybe once a month. Has tried heat and tylenol     HPI Alexa Savage is here for follow up and re-evaluation of chronic medical conditions, medication management and review of any available recent lab and radiology data.  Preventive health is updated, specifically  Cancer screening and Immunization.   Questions or concerns regarding consultations or procedures which the PT has had in the interim are  addressed. The PT denies any adverse reactions to current medications since the last visit.  Does report taking meds inconsistently Intermittent low back ain very severe and radiating to  groin causing her to have to lie down 2 months ago. No ongoing/ associated weakness , numbness or incontinence, no inciting trauma   ROS Denies recent fever or chills. Denies sinus pressure, nasal congestion, ear pain or sore throat. Denies chest congestion, productive cough or wheezing. Denies chest pains, palpitations and leg swelling Denies abdominal pain, nausea, vomiting,diarrhea or constipation.   Denies dysuria, frequency, hesitancy or incontinence.  Denies headaches, seizures, numbness, or tingling. Denies depression, anxiety or insomnia. Denies skin break down or rash.   PE  BP (!) 142/96   Pulse 78   Resp 16   Ht 5\' 6"  (1.676 m)   Wt 211 lb (95.7 kg)   SpO2 96%   BMI 34.06 kg/m   Patient alert and oriented and in no cardiopulmonary distress.  HEENT: No facial asymmetry, EOMI,     Neck supple .  Chest: Clear to auscultation bilaterally.  CVS: S1, S2 no murmurs, no S3.Regular rate.  ABD: Soft non tender.   Ext: No edema  MS: Adequate  ROM spine, shoulders, hips and knees.  Skin: Intact, no ulcerations or rash noted.  Psych: Good eye contact, normal affect. Memory intact not anxious or depressed appearing.  CNS: CN 2-12 intact, power,  normal throughout.no focal deficits noted.   Assessment & Plan  Right-sided low back pain with right-sided sciatica Flare in 02/2023, if recurrs with as much severity will use a trial of meloxicam for short term, if no response , or new neurologic symptoms develop, pt to call for ortho referral  HTN (hypertension) Uncontrolled, non compliant, takes meds unreliably DASH diet and commitment to daily physical activity for a minimum of 30 minutes discussed and encouraged, as a part of hypertension management. The importance of attaining a healthy weight is also discussed.     04/21/2023    3:51 PM 04/21/2023    3:19 PM 12/22/2022    3:55 PM 12/22/2022    3:39 PM 12/22/2022    3:38 PM 08/11/2022    3:19 PM 08/11/2022    3:03 PM  BP/Weight  Systolic BP 142 153 124 124 163 120 137  Diastolic BP 96 82 84 81 98 80 87  Wt. (Lbs)  211   211.04  214  BMI  34.06 kg/m2   35.12 kg/m2  35.61 kg/m2       Morbid obesity (HCC)  Patient re-educated about  the importance of commitment to a  minimum of 150  minutes of exercise per week as able.  The importance of healthy food choices with portion control discussed, as well as eating regularly and within a 12 hour window most days. The need to choose "clean , green" food 50 to 75% of the time is discussed, as well as to make water the primary drink and set a goal of 64 ounces water daily.       04/21/2023    3:19 PM 12/22/2022    3:38 PM 08/11/2022    3:03 PM  Weight /BMI  Weight 211 lb 211 lb 0.6 oz 214 lb  Height 5\' 6"  (1.676 m) 5\' 5"  (1.651 m) 5\' 5"  (1.651 m)  BMI 34.06 kg/m2 35.12 kg/m2 35.61 kg/m2    Unchanged  Vitamin D deficiency Updated lab needed at/ before next visit.   Prediabetes Patient educated about the importance of  limiting  Carbohydrate intake , the need to commit to daily physical activity for a minimum of 30 minutes , and to commit weight loss. The fact that changes in all these areas will reduce or eliminate all together the development of diabetes is stressed.  Improved and now n normal, pt applauded on this     Latest Ref Rng & Units 04/18/2023    1:41 PM 08/09/2022   12:06 PM 02/09/2022    3:24 PM 08/11/2021    4:43 PM 12/02/2020    9:13 AM  Diabetic Labs  HbA1c 4.8 - 5.6 % 5.6  5.9  6.0  5.7    Chol 100 - 199 mg/dL 829  562  130  865    HDL >39 mg/dL 69  73  74  71    Calc LDL 0 - 99 mg/dL 784  696  295  284    Triglycerides 0 - 149 mg/dL 86  74  66  64    Creatinine 0.57 - 1.00 mg/dL 1.32  4.40  1.02  7.25  1.00       04/21/2023    3:51 PM 04/21/2023    3:19 PM 12/22/2022    3:55 PM 12/22/2022    3:39 PM 12/22/2022    3:38 PM 08/11/2022    3:19 PM 08/11/2022    3:03 PM  BP/Weight  Systolic BP 142 153 124 124 163 120 137  Diastolic BP 96 82 84 81 98 80 87  Wt. (Lbs)  211   211.04  214  BMI  34.06 kg/m2   35.12 kg/m2  35.61 kg/m2       No data to display            Hyperlipidemia LDL goal <100 Hyperlipidemia:Low fat diet discussed and encouraged.   Lipid Panel  Lab Results  Component Value Date   CHOL 195 04/18/2023   HDL 69 04/18/2023   LDLCALC 111 (H) 04/18/2023   TRIG 86 04/18/2023   CHOLHDL 2.8 04/18/2023     Needs to reduce fried and fatty foods

## 2023-04-25 NOTE — Assessment & Plan Note (Signed)
 Hyperlipidemia:Low fat diet discussed and encouraged.   Lipid Panel  Lab Results  Component Value Date   CHOL 195 04/18/2023   HDL 69 04/18/2023   LDLCALC 111 (H) 04/18/2023   TRIG 86 04/18/2023   CHOLHDL 2.8 04/18/2023     Needs to reduce fried and fatty foods

## 2023-05-02 ENCOUNTER — Ambulatory Visit
Admission: RE | Admit: 2023-05-02 | Discharge: 2023-05-02 | Disposition: A | Discharge status: Home / Self Care | Payer: BC Managed Care – PPO | Source: Ambulatory Visit | Attending: Family Medicine | Admitting: Family Medicine

## 2023-05-02 DIAGNOSIS — Z1231 Encounter for screening mammogram for malignant neoplasm of breast: Secondary | ICD-10-CM

## 2023-05-05 ENCOUNTER — Ambulatory Visit

## 2023-08-23 ENCOUNTER — Ambulatory Visit: Payer: Self-pay | Admitting: Family Medicine

## 2023-10-25 ENCOUNTER — Ambulatory Visit: Payer: Self-pay | Admitting: Family Medicine

## 2023-11-01 ENCOUNTER — Other Ambulatory Visit: Payer: Self-pay | Admitting: Family Medicine

## 2024-02-21 ENCOUNTER — Ambulatory Visit: Payer: Self-pay | Admitting: Family Medicine

## 2024-02-21 ENCOUNTER — Encounter: Payer: Self-pay | Admitting: Family Medicine

## 2024-02-21 VITALS — BP 144/92 | HR 88 | Ht 65.0 in | Wt 224.0 lb

## 2024-02-21 DIAGNOSIS — E559 Vitamin D deficiency, unspecified: Secondary | ICD-10-CM | POA: Diagnosis not present

## 2024-02-21 DIAGNOSIS — R0981 Nasal congestion: Secondary | ICD-10-CM | POA: Insufficient documentation

## 2024-02-21 DIAGNOSIS — J3089 Other allergic rhinitis: Secondary | ICD-10-CM

## 2024-02-21 DIAGNOSIS — Z20828 Contact with and (suspected) exposure to other viral communicable diseases: Secondary | ICD-10-CM | POA: Diagnosis not present

## 2024-02-21 DIAGNOSIS — R7303 Prediabetes: Secondary | ICD-10-CM | POA: Diagnosis not present

## 2024-02-21 DIAGNOSIS — E785 Hyperlipidemia, unspecified: Secondary | ICD-10-CM | POA: Diagnosis not present

## 2024-02-21 DIAGNOSIS — Z23 Encounter for immunization: Secondary | ICD-10-CM | POA: Diagnosis not present

## 2024-02-21 DIAGNOSIS — I10 Essential (primary) hypertension: Secondary | ICD-10-CM | POA: Diagnosis not present

## 2024-02-21 MED ORDER — SEMAGLUTIDE-WEIGHT MANAGEMENT 0.25 MG/0.5ML ~~LOC~~ SOAJ: 0.2500 mg | SUBCUTANEOUS | 0 refills | Status: AC

## 2024-02-21 MED ORDER — DILTIAZEM HCL ER COATED BEADS 180 MG PO CP24: 180.0000 mg | ORAL_CAPSULE | Freq: Every day | ORAL | 1 refills | Status: AC

## 2024-02-21 MED ORDER — TRIAMTERENE-HCTZ 75-50 MG PO TABS: 1.0000 | ORAL_TABLET | Freq: Every day | ORAL | 1 refills | Status: AC

## 2024-02-21 NOTE — Assessment & Plan Note (Signed)
 2 day h/o nasal/ sinus congestion with positive flu exposure will test for infection for both flu and Covid

## 2024-02-21 NOTE — Assessment & Plan Note (Signed)
 Hyperlipidemia:Low fat diet discussed and encouraged.   Lipid Panel  Lab Results  Component Value Date   CHOL 195 04/18/2023   HDL 69 04/18/2023   LDLCALC 111 (H) 04/18/2023   TRIG 86 04/18/2023   CHOLHDL 2.8 04/18/2023     Updated lab needed at/ before next visit.

## 2024-02-21 NOTE — Progress Notes (Addendum)
 "  Alexa Savage     MRN: [id number]      DOB: July 22, 1970  Chief Complaint  Patient presents with   Hypertension    Follow up    sinus congestion     Pt complains of sinus congestion since this morning, sore throat yesterday. Pt has been exposed to the flu and would like to be tested     HPI Alexa Savage is here for follow up and re-evaluation of chronic medical conditions, medication management and review of any available recent lab and radiology data.  Preventive health is updated, specifically  Cancer screening and Immunization.   Concern as above, no fever or chills or cough or body aches No healthy changes in lifestyle with ongoing weight gain, insufficient sleep, works excessively and noexercise , will work on this  ROS Denies recent fever or chills.  Denies chest congestion, productive cough or wheezing. Denies chest pains, palpitations and leg swelling Denies abdominal pain, nausea, vomiting,diarrhea or constipation.   Denies dysuria, frequency, hesitancy or incontinence. Denies joint pain, swelling and limitation in mobility. Denies headaches, seizures, numbness, or tingling. On average 4 to 5 hours of sleep. Denies skin break down or rash.   PE  There were no vitals taken for this visit.  Patient alert and oriented and in no cardiopulmonary distress.  HEENT: No facial asymmetry, EOMI,     Neck supple .  Chest: Clear to auscultation bilaterally.  CVS: S1, S2 no murmurs, no S3.Regular rate.  ABD: Soft non tender.   Ext: No edema  MS: Adequate ROM spine, shoulders, hips and knees.  Skin: Intact, no ulcerations or rash noted.  Psych: Good eye contact, normal affect. Memory intact not anxious or depressed appearing.  CNS: CN 2-12 intact, power,  normal throughout.no focal deficits noted.   Assessment & Plan  HTN (hypertension) Uncontrolled , non compliant , will start both meds daily as prescreibed DASH diet and commitment to daily physical activity  for a minimum of 30 minutes discussed and encouraged, as a part of hypertension management. The importance of attaining a healthy weight is also discussed.     05/05/2023    3:20 PM 04/21/2023    3:51 PM 04/21/2023    3:19 PM 12/22/2022    3:55 PM 12/22/2022    3:39 PM 12/22/2022    3:38 PM 08/11/2022    3:19 PM  BP/Weight  Systolic BP 139 142 153 124 124 163 120  Diastolic BP 90 96 82 84 81 98 80  Wt. (Lbs)   211   211.04   BMI   34.06 kg/m2   35.12 kg/m2        Prediabetes Patient educated about the importance of limiting  Carbohydrate intake , the need to commit to daily physical activity for a minimum of 30 minutes , and to commit weight loss. The fact that changes in all these areas will reduce or eliminate all together the development of diabetes is stressed.      Latest Ref Rng & Units 04/18/2023    1:41 PM 08/09/2022   12:06 PM 02/09/2022    3:24 PM 08/11/2021    4:43 PM 12/02/2020    9:13 AM  Diabetic Labs  HbA1c 4.8 - 5.6 % 5.6  5.9  6.0  5.7    Chol 100 - 199 mg/dL 804  783  773  792    HDL >39 mg/dL 69  73  74  71    Calc LDL 0 -  99 mg/dL 888  869  858  874    Triglycerides 0 - 149 mg/dL 86  74  66  64    Creatinine 0.57 - 1.00 mg/dL 9.17  9.09  9.11  9.09  1.00       05/05/2023    3:20 PM 04/21/2023    3:51 PM 04/21/2023    3:19 PM 12/22/2022    3:55 PM 12/22/2022    3:39 PM 12/22/2022    3:38 PM 08/11/2022    3:19 PM  BP/Weight  Systolic BP 139 142 153 124 124 163 120  Diastolic BP 90 96 82 84 81 98 80  Wt. (Lbs)   211   211.04   BMI   34.06 kg/m2   35.12 kg/m2        No data to display          Updated lab needed at/ before next visit.   Morbid obesity (HCC)  Patient re-educated about  the importance of commitment to a  minimum of 150 minutes of exercise per week as able.  The importance of healthy food choices with portion control discussed, as well as eating regularly and within a 12 hour window most days. The need to choose clean , green food 50 to  75% of the time is discussed, as well as to make water  the primary drink and set a goal of 64 ounces water  daily.       02/21/2024    6:40 PM 04/21/2023    3:19 PM 12/22/2022    3:38 PM  Weight /BMI  Weight 224 lb 211 lb 211 lb 0.6 oz  Height 5' 5 (1.651 m) 5' 6 (1.676 m) 5' 5 (1.651 m)  BMI 37.28 kg/m2 34.06 kg/m2 35.12 kg/m2    Deteriorated , will start weigh loss medication and change lifestyle  Perennial and seasonal allergic rhinitis 2 day h/o nasal/ sinus congestion with positive flu exposure will test for infection for both flu and Covid  Hyperlipidemia LDL goal <100 Hyperlipidemia:Low fat diet discussed and encouraged.   Lipid Panel  Lab Results  Component Value Date   CHOL 195 04/18/2023   HDL 69 04/18/2023   LDLCALC 111 (H) 04/18/2023   TRIG 86 04/18/2023   CHOLHDL 2.8 04/18/2023     Updated lab needed at/ before next visit.   Sinus congestion New onnset sinus pressure with flu exposure Test for infection with flu and covid   "

## 2024-02-21 NOTE — Assessment & Plan Note (Signed)
 Uncontrolled , non compliant , will start both meds daily as prescreibed DASH diet and commitment to daily physical activity for a minimum of 30 minutes discussed and encouraged, as a part of hypertension management. The importance of attaining a healthy weight is also discussed.     05/05/2023    3:20 PM 04/21/2023    3:51 PM 04/21/2023    3:19 PM 12/22/2022    3:55 PM 12/22/2022    3:39 PM 12/22/2022    3:38 PM 08/11/2022    3:19 PM  BP/Weight  Systolic BP 139 142 153 124 124 163 120  Diastolic BP 90 96 82 84 81 98 80  Wt. (Lbs)   211   211.04   BMI   34.06 kg/m2   35.12 kg/m2

## 2024-02-21 NOTE — Patient Instructions (Signed)
 F/U in 6 to 8 weeks hep B at that visit  Pneumonia vaccine today  Please get covid vaccines in next 1  to 3 weeks   Mammogram  due March 18 or after please schedule  New for weight loss is wegovy , and if not covered will try zepbound  , and of not covered then phentermine    It is important that you exercise regularly at least 30 minutes 5 times a week. If you develop chest pain, have severe difficulty breathing, or feel very tired, stop exercising immediately and seek medical attention    WORK / LIFE baLANCR is in your hands  Fasting cBC, lipid, cmp and EGFr, HBa1C, TSH and vit D  Think about what you will eat, plan ahead. Choose  clean, green, fresh or frozen over canned, processed or packaged foods which are more sugary, salty and fatty. 70 to 75% of food eaten should be vegetables and fruit. Three meals at set times with snacks allowed between meals, but they must be fruit or vegetables. Aim to eat over a 12 hour period , example 7 am to 7 pm, and STOP after  your last meal of the day. Drink water ,generally about 64 ounces per day, no other drink is as healthy. Fruit juice is best enjoyed in a healthy way, by EATING the fruit. It is important that you exercise regularly at least 30 minutes 5 times a week. If you develop chest pain, have severe difficulty breathing, or feel very tired, stop exercising immediately and seek medical attention   .[place]sea

## 2024-02-21 NOTE — Assessment & Plan Note (Signed)
 New onnset sinus pressure with flu exposure Test for infection with flu and covid

## 2024-02-21 NOTE — Assessment & Plan Note (Signed)
 Patient educated about the importance of limiting  Carbohydrate intake , the need to commit to daily physical activity for a minimum of 30 minutes , and to commit weight loss. The fact that changes in all these areas will reduce or eliminate all together the development of diabetes is stressed.      Latest Ref Rng & Units 04/18/2023    1:41 PM 08/09/2022   12:06 PM 02/09/2022    3:24 PM 08/11/2021    4:43 PM 12/02/2020    9:13 AM  Diabetic Labs  HbA1c 4.8 - 5.6 % 5.6  5.9  6.0  5.7    Chol 100 - 199 mg/dL 804  783  773  792    HDL >39 mg/dL 69  73  74  71    Calc LDL 0 - 99 mg/dL 888  869  858  874    Triglycerides 0 - 149 mg/dL 86  74  66  64    Creatinine 0.57 - 1.00 mg/dL 9.17  9.09  9.11  9.09  1.00       05/05/2023    3:20 PM 04/21/2023    3:51 PM 04/21/2023    3:19 PM 12/22/2022    3:55 PM 12/22/2022    3:39 PM 12/22/2022    3:38 PM 08/11/2022    3:19 PM  BP/Weight  Systolic BP 139 142 153 124 124 163 120  Diastolic BP 90 96 82 84 81 98 80  Wt. (Lbs)   211   211.04   BMI   34.06 kg/m2   35.12 kg/m2        No data to display          Updated lab needed at/ before next visit.

## 2024-02-21 NOTE — Assessment & Plan Note (Addendum)
" °  Patient re-educated about  the importance of commitment to a  minimum of 150 minutes of exercise per week as able.  The importance of healthy food choices with portion control discussed, as well as eating regularly and within a 12 hour window most days. The need to choose clean , green food 50 to 75% of the time is discussed, as well as to make water  the primary drink and set a goal of 64 ounces water  daily.       02/21/2024    6:40 PM 04/21/2023    3:19 PM 12/22/2022    3:38 PM  Weight /BMI  Weight 224 lb 211 lb 211 lb 0.6 oz  Height 5' 5 (1.651 m) 5' 6 (1.676 m) 5' 5 (1.651 m)  BMI 37.28 kg/m2 34.06 kg/m2 35.12 kg/m2    Deteriorated , will start weigh loss medication and change lifestyle "

## 2024-02-23 ENCOUNTER — Ambulatory Visit: Payer: Self-pay | Admitting: Family Medicine

## 2024-02-23 ENCOUNTER — Encounter: Payer: Self-pay | Admitting: Family Medicine

## 2024-02-23 LAB — LIPID PANEL
Chol/HDL Ratio: 2.7 ratio (ref 0.0–4.4)
Cholesterol, Total: 206 mg/dL — ABNORMAL HIGH (ref 100–199)
HDL: 75 mg/dL
LDL Chol Calc (NIH): 118 mg/dL — ABNORMAL HIGH (ref 0–99)
Triglycerides: 75 mg/dL (ref 0–149)
VLDL Cholesterol Cal: 13 mg/dL (ref 5–40)

## 2024-02-23 LAB — CBC WITH DIFFERENTIAL/PLATELET
Basophils Absolute: 0 x10E3/uL (ref 0.0–0.2)
Basos: 1 %
EOS (ABSOLUTE): 0.1 x10E3/uL (ref 0.0–0.4)
Eos: 1 %
Hematocrit: 43.2 % (ref 34.0–46.6)
Hemoglobin: 14 g/dL (ref 11.1–15.9)
Immature Grans (Abs): 0 x10E3/uL (ref 0.0–0.1)
Immature Granulocytes: 0 %
Lymphocytes Absolute: 2.5 x10E3/uL (ref 0.7–3.1)
Lymphs: 42 %
MCH: 28.6 pg (ref 26.6–33.0)
MCHC: 32.4 g/dL (ref 31.5–35.7)
MCV: 88 fL (ref 79–97)
Monocytes Absolute: 0.5 x10E3/uL (ref 0.1–0.9)
Monocytes: 8 %
Neutrophils Absolute: 2.9 x10E3/uL (ref 1.4–7.0)
Neutrophils: 47 %
Platelets: 283 x10E3/uL (ref 150–450)
RBC: 4.89 x10E6/uL (ref 3.77–5.28)
RDW: 12.7 % (ref 11.7–15.4)
WBC: 6 x10E3/uL (ref 3.4–10.8)

## 2024-02-23 LAB — CMP14+EGFR
ALT: 27 IU/L (ref 0–32)
AST: 21 IU/L (ref 0–40)
Albumin: 4.5 g/dL (ref 3.8–4.9)
Alkaline Phosphatase: 86 IU/L (ref 49–135)
BUN/Creatinine Ratio: 13 (ref 9–23)
BUN: 12 mg/dL (ref 6–24)
Bilirubin Total: 0.6 mg/dL (ref 0.0–1.2)
CO2: 24 mmol/L (ref 20–29)
Calcium: 9.9 mg/dL (ref 8.7–10.2)
Chloride: 103 mmol/L (ref 96–106)
Creatinine, Ser: 0.9 mg/dL (ref 0.57–1.00)
Globulin, Total: 2.5 g/dL (ref 1.5–4.5)
Glucose: 89 mg/dL (ref 70–99)
Potassium: 4.3 mmol/L (ref 3.5–5.2)
Sodium: 139 mmol/L (ref 134–144)
Total Protein: 7 g/dL (ref 6.0–8.5)
eGFR: 75 mL/min/1.73

## 2024-02-23 LAB — HEMOGLOBIN A1C
Est. average glucose Bld gHb Est-mCnc: 117 mg/dL
Hgb A1c MFr Bld: 5.7 % — ABNORMAL HIGH (ref 4.8–5.6)

## 2024-02-23 LAB — VITAMIN D 25 HYDROXY (VIT D DEFICIENCY, FRACTURES): Vit D, 25-Hydroxy: 20.9 ng/mL — ABNORMAL LOW (ref 30.0–100.0)

## 2024-02-23 LAB — TSH: TSH: 1.43 u[IU]/mL (ref 0.450–4.500)

## 2024-02-23 MED ORDER — PHENTERMINE HCL 15 MG PO CAPS: 15.0000 mg | ORAL_CAPSULE | ORAL | 2 refills | Status: AC

## 2024-02-23 MED ORDER — VITAMIN D (ERGOCALCIFEROL) 1.25 MG (50000 UNIT) PO CAPS: 50000.0000 [IU] | ORAL_CAPSULE | ORAL | 7 refills | Status: AC

## 2024-02-24 ENCOUNTER — Other Ambulatory Visit (HOSPITAL_COMMUNITY): Payer: Self-pay

## 2024-02-24 LAB — NOVEL CORONAVIRUS, NAA: SARS-CoV-2, NAA: NOT DETECTED

## 2024-04-19 ENCOUNTER — Ambulatory Visit: Payer: Self-pay | Admitting: Family Medicine
